# Patient Record
Sex: Male | Born: 1945 | Race: White | Hispanic: No | Marital: Married | Smoking: Former smoker
Health system: Southern US, Community
[De-identification: ages and names within clinical notes are randomized; demographics above are authoritative.]

## PROBLEM LIST (undated history)

## (undated) DIAGNOSIS — I7121 Aneurysm of the ascending aorta, without rupture: Secondary | ICD-10-CM

## (undated) DIAGNOSIS — I712 Thoracic aortic aneurysm, without rupture: Secondary | ICD-10-CM

## (undated) DIAGNOSIS — I499 Cardiac arrhythmia, unspecified: Secondary | ICD-10-CM

## (undated) DIAGNOSIS — I482 Chronic atrial fibrillation, unspecified: Secondary | ICD-10-CM

## (undated) DIAGNOSIS — Z973 Presence of spectacles and contact lenses: Secondary | ICD-10-CM

## (undated) DIAGNOSIS — I420 Dilated cardiomyopathy: Secondary | ICD-10-CM

## (undated) DIAGNOSIS — Z960 Presence of urogenital implants: Secondary | ICD-10-CM

## (undated) DIAGNOSIS — Z9889 Other specified postprocedural states: Secondary | ICD-10-CM

## (undated) DIAGNOSIS — Z978 Presence of other specified devices: Secondary | ICD-10-CM

## (undated) DIAGNOSIS — N400 Enlarged prostate without lower urinary tract symptoms: Secondary | ICD-10-CM

## (undated) DIAGNOSIS — E785 Hyperlipidemia, unspecified: Secondary | ICD-10-CM

## (undated) DIAGNOSIS — I77810 Thoracic aortic ectasia: Secondary | ICD-10-CM

## (undated) DIAGNOSIS — Z7901 Long term (current) use of anticoagulants: Secondary | ICD-10-CM

## (undated) DIAGNOSIS — R339 Retention of urine, unspecified: Secondary | ICD-10-CM

## (undated) DIAGNOSIS — R112 Nausea with vomiting, unspecified: Secondary | ICD-10-CM

## (undated) DIAGNOSIS — M1990 Unspecified osteoarthritis, unspecified site: Secondary | ICD-10-CM

## (undated) HISTORY — PX: TOTAL KNEE ARTHROPLASTY: SHX125

## (undated) HISTORY — DX: Hyperlipidemia, unspecified: E78.5

## (undated) HISTORY — PX: CARDIAC CATHETERIZATION: SHX172

## (undated) HISTORY — PX: BACK SURGERY: SHX140

## (undated) HISTORY — PX: CARDIOVASCULAR STRESS TEST: SHX262

## (undated) HISTORY — PX: JOINT REPLACEMENT: SHX530

## (undated) HISTORY — PX: TRANSTHORACIC ECHOCARDIOGRAM: SHX275

---

## 2014-12-06 ENCOUNTER — Other Ambulatory Visit: Payer: Self-pay | Admitting: Neurosurgery

## 2014-12-26 ENCOUNTER — Encounter (HOSPITAL_COMMUNITY): Payer: Self-pay

## 2014-12-26 ENCOUNTER — Encounter (HOSPITAL_COMMUNITY)
Admission: RE | Admit: 2014-12-26 | Discharge: 2014-12-26 | Disposition: A | Discharge status: Home / Self Care | Payer: Medicare Other | Source: Ambulatory Visit | Attending: Neurosurgery | Admitting: Neurosurgery

## 2014-12-26 DIAGNOSIS — Z79899 Other long term (current) drug therapy: Secondary | ICD-10-CM | POA: Insufficient documentation

## 2014-12-26 DIAGNOSIS — I4891 Unspecified atrial fibrillation: Secondary | ICD-10-CM | POA: Diagnosis not present

## 2014-12-26 DIAGNOSIS — Z7901 Long term (current) use of anticoagulants: Secondary | ICD-10-CM | POA: Insufficient documentation

## 2014-12-26 DIAGNOSIS — I10 Essential (primary) hypertension: Secondary | ICD-10-CM | POA: Insufficient documentation

## 2014-12-26 DIAGNOSIS — R9431 Abnormal electrocardiogram [ECG] [EKG]: Secondary | ICD-10-CM | POA: Diagnosis not present

## 2014-12-26 DIAGNOSIS — Z01818 Encounter for other preprocedural examination: Secondary | ICD-10-CM | POA: Diagnosis present

## 2014-12-26 DIAGNOSIS — Z01812 Encounter for preprocedural laboratory examination: Secondary | ICD-10-CM | POA: Insufficient documentation

## 2014-12-26 HISTORY — DX: Dilated cardiomyopathy: I42.0

## 2014-12-26 HISTORY — DX: Thoracic aortic ectasia: I77.810

## 2014-12-26 HISTORY — DX: Cardiac arrhythmia, unspecified: I49.9

## 2014-12-26 HISTORY — DX: Unspecified osteoarthritis, unspecified site: M19.90

## 2014-12-26 LAB — APTT: APTT: 32 s (ref 24–37)

## 2014-12-26 LAB — CBC
HEMATOCRIT: 49.3 % (ref 39.0–52.0)
HEMOGLOBIN: 16.9 g/dL (ref 13.0–17.0)
MCH: 31.6 pg (ref 26.0–34.0)
MCHC: 34.3 g/dL (ref 30.0–36.0)
MCV: 92.1 fL (ref 78.0–100.0)
Platelets: 149 10*3/uL — ABNORMAL LOW (ref 150–400)
RBC: 5.35 MIL/uL (ref 4.22–5.81)
RDW: 12.8 % (ref 11.5–15.5)
WBC: 7.6 10*3/uL (ref 4.0–10.5)

## 2014-12-26 LAB — PROTIME-INR
INR: 1.66 — AB (ref 0.00–1.49)
PROTHROMBIN TIME: 19.6 s — AB (ref 11.6–15.2)

## 2014-12-26 LAB — BASIC METABOLIC PANEL
ANION GAP: 6 (ref 5–15)
BUN: 14 mg/dL (ref 6–20)
CALCIUM: 8.9 mg/dL (ref 8.9–10.3)
CO2: 27 mmol/L (ref 22–32)
CREATININE: 1.12 mg/dL (ref 0.61–1.24)
Chloride: 107 mmol/L (ref 101–111)
GFR calc non Af Amer: >60 mL/min (ref >60)
Glucose, Bld: 95 mg/dL (ref 65–99)
Potassium: 3.9 mmol/L (ref 3.5–5.1)
SODIUM: 140 mmol/L (ref 135–145)

## 2014-12-26 LAB — SURGICAL PCR SCREEN
MRSA, PCR: NEGATIVE
Staphylococcus aureus: NEGATIVE

## 2014-12-26 NOTE — Progress Notes (Addendum)
Anesthesia Chart Review: Patient is a 69 year old male scheduled for L4-5 laminectomy and foraminotomy on 9/05/15/14 by Dr. Wynetta Emery.  History includes smoking, afib, arthritis, TKR. He reports she "woke up too early" from anesthesia in the past.   Cardiologist is Dr. Rolene Arbour who signed a note stating, "He is low risk for cardiac events with the planned surgery. He may hold warfarin 1 week prior and resume when acceptable from a surgical standpoint."  Cardiology records requested, but are still pending.   Meds include atenolol at HS, folvite, warfarin.   12/26/14 EKG: Afib at 84 bpm, PVCs, cannot rule out anterior infarct (age undetermined). (HR was recorded at 41 bpm on arrival with BP of 81/64. Unfortunately, his BP was not rechecked.)  Preoperative labs noted. PT/INR elevated at 19.6/1.66. He is scheduled to hold warfarin starting 12/27/14. Will recheck a PT/INR on the day of surgery.  Chart will be left for follow-up regarding any additional cardiology records.  Velna Ochs [facility] Short Stay Center/Anesthesiology Phone [phone] 12/26/2014 4:58 PM  Addendum: I called and spoke with patient this afternoon, and I called and spoke with nurse Violet at [facility] with Dr. Rolene Arbour. She was able to send needed records. Patient said he has known issues with hypotension. No SOB or chest pain.   Patient was last seen by Dr. Rolene Arbour on 12/01/14. BP was 110/64, HR 74 bpm. EKG showed afib at 91 bpm, frequent ectopic ventricular beats, non-specific T wave abnormality. Patient has since moved to [place] (last week in fact) and is still in the process of deciding if he will continue care with Dr. Rolene Arbour in [place] or find a local cardiologist in or near [place].   05/26/14 Echo [facility] Cardiology): Mild to moderate global LV dysfunction. Estimated LVEF 40-45%. Moderately dilated aortic root with mild aortic insufficiency, mild LAE with atrial fib.  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 There is no evidence of ischemia. The patient had rapid rise in his heart rate with activity and his beta blocker is being increased on that basis. (Dr. Loura Halt)  04/10/06 Cardiac Cath [facility]): Mild non-obstructive plaquing without obstructive coronary lesions. Mild global LV dysfunction, EF 40-45%. Normal right heart pressures. 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 He is maintained on atenolol. His BP would not allow the addition of an ACE inhibitor. (Dr. Loura Halt)  He will get a repeat PT/INR and vitals on arrival. If these are acceptable then I would anticipate that he could proceed as planned.  Velna Ochs [facility] Short Stay Center/Anesthesiology Phone [phone] 12/28/2014 4:14 PM

## 2014-12-26 NOTE — Pre-Procedure Instructions (Signed)
    Christopher Goodwin  [address]  No Pharmacies Listed   Your procedure is scheduled on 01/03/15.  Report to [facility] Admitting at 630 A.M.  Call this number if you have problems the morning of surgery:  [phone]   Remember:  Do not eat food or drink liquids after midnight.  Take these medicines the morning of surgery with A SIP OF WATER --   Do not wear jewelry, make-up or nail polish.  Do not wear lotions, powders, or perfumes.  You may wear deodorant.  Do not shave 48 hours prior to surgery.  Men may shave face and neck.  Do not bring valuables to the hospital.  [facility] is not responsible for any belongings or valuables.  Contacts, dentures or bridgework may not be worn into surgery.  Leave your suitcase in the car.  After surgery it may be brought to your room.  For patients admitted to the hospital, discharge time will be determined by your treatment team.  Patients discharged the day of surgery will not be allowed to drive home.   Name and phone number of your driver:   Special instructions:  Please read over the following fact sheets that you were given. Pain Booklet, Coughing and Deep Breathing, MRSA Information and Surgical Site Infection Prevention

## 2014-12-28 ENCOUNTER — Encounter (HOSPITAL_COMMUNITY): Payer: Self-pay

## 2015-01-02 MED ORDER — DEXAMETHASONE SODIUM PHOSPHATE 10 MG/ML IJ SOLN
10.0000 mg | INTRAMUSCULAR | Status: AC
  Administered 2015-01-03: 10 mg via INTRAVENOUS
  Filled 2015-01-02: qty 1

## 2015-01-02 MED ORDER — DEXTROSE 5 % IV SOLN
3.0000 g | INTRAVENOUS | Status: AC
  Administered 2015-01-03: 3 g via INTRAVENOUS
  Filled 2015-01-02: qty 3000

## 2015-01-03 ENCOUNTER — Encounter (HOSPITAL_COMMUNITY)
Admission: RE | Disposition: A | Discharge status: Home / Self Care | Payer: Self-pay | Source: Ambulatory Visit | Attending: Neurosurgery

## 2015-01-03 ENCOUNTER — Encounter (HOSPITAL_COMMUNITY): Payer: Self-pay | Admitting: General Practice

## 2015-01-03 ENCOUNTER — Inpatient Hospital Stay (HOSPITAL_COMMUNITY)
Admission: RE | Admit: 2015-01-03 | Discharge: 2015-01-03 | DRG: 517 | Disposition: A | Discharge status: Home / Self Care | Payer: Medicare Other | Source: Ambulatory Visit | Attending: Neurosurgery | Admitting: Neurosurgery

## 2015-01-03 ENCOUNTER — Inpatient Hospital Stay (HOSPITAL_COMMUNITY): Payer: Medicare Other

## 2015-01-03 ENCOUNTER — Inpatient Hospital Stay (HOSPITAL_COMMUNITY): Payer: Medicare Other | Admitting: Vascular Surgery

## 2015-01-03 ENCOUNTER — Inpatient Hospital Stay (HOSPITAL_COMMUNITY): Payer: Medicare Other | Admitting: Anesthesiology

## 2015-01-03 DIAGNOSIS — Z96659 Presence of unspecified artificial knee joint: Secondary | ICD-10-CM | POA: Diagnosis not present

## 2015-01-03 DIAGNOSIS — M79605 Pain in left leg: Secondary | ICD-10-CM | POA: Diagnosis present

## 2015-01-03 DIAGNOSIS — Z79899 Other long term (current) drug therapy: Secondary | ICD-10-CM

## 2015-01-03 DIAGNOSIS — F17290 Nicotine dependence, other tobacco product, uncomplicated: Secondary | ICD-10-CM | POA: Diagnosis present

## 2015-01-03 DIAGNOSIS — Z419 Encounter for procedure for purposes other than remedying health state, unspecified: Secondary | ICD-10-CM

## 2015-01-03 DIAGNOSIS — Z7901 Long term (current) use of anticoagulants: Secondary | ICD-10-CM

## 2015-01-03 DIAGNOSIS — M4806 Spinal stenosis, lumbar region: Principal | ICD-10-CM | POA: Diagnosis present

## 2015-01-03 DIAGNOSIS — M48061 Spinal stenosis, lumbar region without neurogenic claudication: Secondary | ICD-10-CM | POA: Diagnosis present

## 2015-01-03 HISTORY — PX: LUMBAR LAMINECTOMY/DECOMPRESSION MICRODISCECTOMY: SHX5026

## 2015-01-03 LAB — PROTIME-INR
INR: 1.11 (ref 0.00–1.49)
Prothrombin Time: 14.5 seconds (ref 11.6–15.2)

## 2015-01-03 SURGERY — LUMBAR LAMINECTOMY/DECOMPRESSION MICRODISCECTOMY 1 LEVEL
Anesthesia: General | Site: Back | Laterality: Left

## 2015-01-03 MED ORDER — FOLIC ACID 1 MG PO TABS
1.0000 mg | ORAL_TABLET | Freq: Every day | ORAL | Status: DC
  Filled 2015-01-03: qty 1

## 2015-01-03 MED ORDER — MIDAZOLAM HCL 5 MG/5ML IJ SOLN
INTRAMUSCULAR | Status: DC | PRN
  Administered 2015-01-03: 2 mg via INTRAVENOUS

## 2015-01-03 MED ORDER — VITAMIN D 1000 UNITS PO TABS: 2000.0000 [IU] | ORAL_TABLET | Freq: Every day | ORAL | Status: DC

## 2015-01-03 MED ORDER — MIDAZOLAM HCL 2 MG/2ML IJ SOLN
INTRAMUSCULAR | Status: AC
  Filled 2015-01-03: qty 4

## 2015-01-03 MED ORDER — SUCCINYLCHOLINE CHLORIDE 20 MG/ML IJ SOLN
INTRAMUSCULAR | Status: AC
  Filled 2015-01-03: qty 1

## 2015-01-03 MED ORDER — THROMBIN 5000 UNITS EX SOLR
CUTANEOUS | Status: DC | PRN
  Administered 2015-01-03 (×2): 5000 [IU] via TOPICAL

## 2015-01-03 MED ORDER — HYDROMORPHONE HCL 1 MG/ML IJ SOLN: 0.5000 mg | INTRAMUSCULAR | Status: DC | PRN

## 2015-01-03 MED ORDER — FENTANYL CITRATE (PF) 250 MCG/5ML IJ SOLN
INTRAMUSCULAR | Status: DC | PRN
  Administered 2015-01-03: 100 ug via INTRAVENOUS

## 2015-01-03 MED ORDER — ARTIFICIAL TEARS OP OINT
TOPICAL_OINTMENT | OPHTHALMIC | Status: DC | PRN
  Administered 2015-01-03: 1 via OPHTHALMIC

## 2015-01-03 MED ORDER — CYCLOBENZAPRINE HCL 10 MG PO TABS: 10.0000 mg | ORAL_TABLET | Freq: Three times a day (TID) | ORAL | Status: DC | PRN

## 2015-01-03 MED ORDER — LACTATED RINGERS IV SOLN
INTRAVENOUS | Status: DC | PRN
  Administered 2015-01-03 (×2): via INTRAVENOUS

## 2015-01-03 MED ORDER — CYANOCOBALAMIN 1000 MCG/ML IJ SOLN: 1000.0000 ug | INTRAMUSCULAR | Status: DC

## 2015-01-03 MED ORDER — SODIUM CHLORIDE 0.9 % IR SOLN
Status: DC | PRN
  Administered 2015-01-03: 10:00:00

## 2015-01-03 MED ORDER — MENTHOL 3 MG MT LOZG
1.0000 | LOZENGE | OROMUCOSAL | Status: DC | PRN
  Filled 2015-01-03: qty 9

## 2015-01-03 MED ORDER — FENTANYL CITRATE (PF) 250 MCG/5ML IJ SOLN
INTRAMUSCULAR | Status: AC
  Filled 2015-01-03: qty 5

## 2015-01-03 MED ORDER — ROCURONIUM BROMIDE 50 MG/5ML IV SOLN
INTRAVENOUS | Status: AC
  Filled 2015-01-03: qty 1

## 2015-01-03 MED ORDER — ACETAMINOPHEN 325 MG PO TABS: 650.0000 mg | ORAL_TABLET | ORAL | Status: DC | PRN

## 2015-01-03 MED ORDER — HYDROMORPHONE HCL 1 MG/ML IJ SOLN: 0.2500 mg | INTRAMUSCULAR | Status: DC | PRN

## 2015-01-03 MED ORDER — GLYCOPYRROLATE 0.2 MG/ML IJ SOLN
INTRAMUSCULAR | Status: DC | PRN
  Administered 2015-01-03: 0.4 mg via INTRAVENOUS

## 2015-01-03 MED ORDER — LIDOCAINE-EPINEPHRINE 1 %-1:100000 IJ SOLN
INTRAMUSCULAR | Status: DC | PRN
  Administered 2015-01-03: 10 mL

## 2015-01-03 MED ORDER — ONDANSETRON HCL 4 MG/2ML IJ SOLN: 4.0000 mg | INTRAMUSCULAR | Status: DC | PRN

## 2015-01-03 MED ORDER — PHENOL 1.4 % MT LIQD: 1.0000 | OROMUCOSAL | Status: DC | PRN

## 2015-01-03 MED ORDER — BUPIVACAINE HCL (PF) 0.25 % IJ SOLN
INTRAMUSCULAR | Status: DC | PRN
  Administered 2015-01-03 (×2): 10 mL

## 2015-01-03 MED ORDER — PROPOFOL 10 MG/ML IV BOLUS
INTRAVENOUS | Status: DC | PRN
  Administered 2015-01-03: 200 mg via INTRAVENOUS

## 2015-01-03 MED ORDER — MEPERIDINE HCL 25 MG/ML IJ SOLN: 6.2500 mg | INTRAMUSCULAR | Status: DC | PRN

## 2015-01-03 MED ORDER — 0.9 % SODIUM CHLORIDE (POUR BTL) OPTIME
TOPICAL | Status: DC | PRN
  Administered 2015-01-03: 1000 mL

## 2015-01-03 MED ORDER — HYDROCODONE-ACETAMINOPHEN 5-325 MG PO TABS: 1.0000 | ORAL_TABLET | ORAL | Status: DC | PRN

## 2015-01-03 MED ORDER — ROCURONIUM BROMIDE 100 MG/10ML IV SOLN
INTRAVENOUS | Status: DC | PRN
  Administered 2015-01-03: 50 mg via INTRAVENOUS

## 2015-01-03 MED ORDER — PHENYLEPHRINE 40 MCG/ML (10ML) SYRINGE FOR IV PUSH (FOR BLOOD PRESSURE SUPPORT)
PREFILLED_SYRINGE | INTRAVENOUS | Status: AC
  Filled 2015-01-03: qty 10

## 2015-01-03 MED ORDER — ATENOLOL 50 MG PO TABS
50.0000 mg | ORAL_TABLET | Freq: Every day | ORAL | Status: DC
  Filled 2015-01-03: qty 1

## 2015-01-03 MED ORDER — ACETAMINOPHEN 650 MG RE SUPP: 650.0000 mg | RECTAL | Status: DC | PRN

## 2015-01-03 MED ORDER — ONDANSETRON HCL 4 MG/2ML IJ SOLN
INTRAMUSCULAR | Status: DC | PRN
  Administered 2015-01-03: 4 mg via INTRAVENOUS

## 2015-01-03 MED ORDER — SODIUM CHLORIDE 0.9 % IJ SOLN
INTRAMUSCULAR | Status: AC
  Filled 2015-01-03: qty 10

## 2015-01-03 MED ORDER — EPHEDRINE SULFATE 50 MG/ML IJ SOLN
INTRAMUSCULAR | Status: DC | PRN
  Administered 2015-01-03 (×2): 10 mg via INTRAVENOUS

## 2015-01-03 MED ORDER — SODIUM CHLORIDE 0.9 % IJ SOLN: 3.0000 mL | INTRAMUSCULAR | Status: DC | PRN

## 2015-01-03 MED ORDER — NEOSTIGMINE METHYLSULFATE 10 MG/10ML IV SOLN
INTRAVENOUS | Status: DC | PRN
Start: 2015-01-03 — End: 2015-01-03
  Administered 2015-01-03: 3 mg via INTRAVENOUS

## 2015-01-03 MED ORDER — LIDOCAINE HCL (CARDIAC) 20 MG/ML IV SOLN
INTRAVENOUS | Status: AC
  Filled 2015-01-03: qty 5

## 2015-01-03 MED ORDER — PROMETHAZINE HCL 25 MG/ML IJ SOLN
6.2500 mg | INTRAMUSCULAR | Status: DC | PRN
Start: 2015-01-03 — End: 2015-01-03

## 2015-01-03 MED ORDER — HEMOSTATIC AGENTS (NO CHARGE) OPTIME
TOPICAL | Status: DC | PRN
  Administered 2015-01-03: 1 via TOPICAL

## 2015-01-03 MED ORDER — LIDOCAINE HCL (CARDIAC) 20 MG/ML IV SOLN
INTRAVENOUS | Status: DC | PRN
  Administered 2015-01-03: 100 mg via INTRAVENOUS

## 2015-01-03 MED ORDER — PROPOFOL 10 MG/ML IV BOLUS
INTRAVENOUS | Status: AC
  Filled 2015-01-03: qty 20

## 2015-01-03 MED ORDER — SODIUM CHLORIDE 0.9 % IJ SOLN
3.0000 mL | Freq: Two times a day (BID) | INTRAMUSCULAR | Status: DC
  Administered 2015-01-03: 3 mL via INTRAVENOUS

## 2015-01-03 MED ORDER — CEFAZOLIN SODIUM 1-5 GM-% IV SOLN
1.0000 g | Freq: Three times a day (TID) | INTRAVENOUS | Status: DC
  Administered 2015-01-03: 1 g via INTRAVENOUS
  Filled 2015-01-03 (×2): qty 50

## 2015-01-03 MED ORDER — EPHEDRINE SULFATE 50 MG/ML IJ SOLN
INTRAMUSCULAR | Status: AC
  Filled 2015-01-03: qty 1

## 2015-01-03 SURGICAL SUPPLY — 51 items
BAG DECANTER FOR FLEXI CONT (MISCELLANEOUS) ×3 IMPLANT
BENZOIN TINCTURE PRP APPL 2/3 (GAUZE/BANDAGES/DRESSINGS) ×3 IMPLANT
BLADE CLIPPER SURG (BLADE) IMPLANT
BLADE SURG 11 STRL SS (BLADE) ×3 IMPLANT
BRUSH SCRUB EZ PLAIN DRY (MISCELLANEOUS) ×3 IMPLANT
BUR MATCHSTICK NEURO 3.0 LAGG (BURR) ×3 IMPLANT
BUR PRECISION FLUTE 6.0 (BURR) ×3 IMPLANT
CANISTER SUCT 3000ML PPV (MISCELLANEOUS) ×3 IMPLANT
CLOSURE WOUND 1/2 X4 (GAUZE/BANDAGES/DRESSINGS) ×1
DECANTER SPIKE VIAL GLASS SM (MISCELLANEOUS) ×6 IMPLANT
DRAPE LAPAROTOMY 100X72X124 (DRAPES) ×3 IMPLANT
DRAPE MICROSCOPE LEICA (MISCELLANEOUS) ×6 IMPLANT
DRAPE POUCH INSTRU U-SHP 10X18 (DRAPES) ×3 IMPLANT
DRAPE PROXIMA HALF (DRAPES) IMPLANT
DRAPE SURG 17X23 STRL (DRAPES) ×3 IMPLANT
DRSG OPSITE POSTOP 4X6 (GAUZE/BANDAGES/DRESSINGS) ×6 IMPLANT
DURAPREP 26ML APPLICATOR (WOUND CARE) ×3 IMPLANT
ELECT BLADE 4.0 EZ CLEAN MEGAD (MISCELLANEOUS) ×3
ELECT REM PT RETURN 9FT ADLT (ELECTROSURGICAL) ×3
ELECTRODE BLDE 4.0 EZ CLN MEGD (MISCELLANEOUS) ×1 IMPLANT
ELECTRODE REM PT RTRN 9FT ADLT (ELECTROSURGICAL) ×1 IMPLANT
GAUZE SPONGE 4X4 12PLY STRL (GAUZE/BANDAGES/DRESSINGS) ×3 IMPLANT
GAUZE SPONGE 4X4 16PLY XRAY LF (GAUZE/BANDAGES/DRESSINGS) IMPLANT
GLOVE BIO SURGEON STRL SZ8 (GLOVE) ×3 IMPLANT
GLOVE EXAM NITRILE LRG STRL (GLOVE) IMPLANT
GLOVE EXAM NITRILE MD LF STRL (GLOVE) IMPLANT
GLOVE EXAM NITRILE XL STR (GLOVE) IMPLANT
GLOVE EXAM NITRILE XS STR PU (GLOVE) IMPLANT
GLOVE INDICATOR 8.5 STRL (GLOVE) ×3 IMPLANT
GOWN STRL REUS W/ TWL LRG LVL3 (GOWN DISPOSABLE) ×1 IMPLANT
GOWN STRL REUS W/ TWL XL LVL3 (GOWN DISPOSABLE) ×2 IMPLANT
GOWN STRL REUS W/TWL 2XL LVL3 (GOWN DISPOSABLE) IMPLANT
GOWN STRL REUS W/TWL LRG LVL3 (GOWN DISPOSABLE) ×2
GOWN STRL REUS W/TWL XL LVL3 (GOWN DISPOSABLE) ×4
KIT BASIN OR (CUSTOM PROCEDURE TRAY) ×3 IMPLANT
KIT ROOM TURNOVER OR (KITS) ×3 IMPLANT
LIQUID BAND (GAUZE/BANDAGES/DRESSINGS) ×3 IMPLANT
NEEDLE HYPO 22GX1.5 SAFETY (NEEDLE) ×3 IMPLANT
NEEDLE SPNL 22GX3.5 QUINCKE BK (NEEDLE) ×3 IMPLANT
NS IRRIG 1000ML POUR BTL (IV SOLUTION) ×3 IMPLANT
PACK LAMINECTOMY NEURO (CUSTOM PROCEDURE TRAY) ×3 IMPLANT
RUBBERBAND STERILE (MISCELLANEOUS) ×12 IMPLANT
SPONGE SURGIFOAM ABS GEL SZ50 (HEMOSTASIS) ×3 IMPLANT
STRIP CLOSURE SKIN 1/2X4 (GAUZE/BANDAGES/DRESSINGS) ×2 IMPLANT
SUT VIC AB 0 CT1 18XCR BRD8 (SUTURE) ×1 IMPLANT
SUT VIC AB 0 CT1 8-18 (SUTURE) ×2
SUT VIC AB 2-0 CT1 18 (SUTURE) ×3 IMPLANT
SUT VICRYL 4-0 PS2 18IN ABS (SUTURE) ×3 IMPLANT
TOWEL OR 17X24 6PK STRL BLUE (TOWEL DISPOSABLE) ×3 IMPLANT
TOWEL OR 17X26 10 PK STRL BLUE (TOWEL DISPOSABLE) ×3 IMPLANT
WATER STERILE IRR 1000ML POUR (IV SOLUTION) ×3 IMPLANT

## 2015-01-03 NOTE — Anesthesia Preprocedure Evaluation (Addendum)
Anesthesia Evaluation  Patient identified by MRN, date of birth, ID band Patient awake    Reviewed: Allergy & Precautions, NPO status , Patient's Chart, lab work & pertinent test results, reviewed documented beta blocker date and time   History of Anesthesia Complications Negative for: history of anesthetic complications  Airway Mallampati: II  TM Distance: >3 FB Neck ROM: Full    Dental no notable dental hx.    Pulmonary Current Smoker,    Pulmonary exam normal breath sounds clear to auscultation       Cardiovascular Pt. on home beta blockers + Peripheral Vascular Disease  + dysrhythmias Atrial Fibrillation + Valvular Problems/Murmurs AI  Rhythm:Irregular Rate:Normal  EF 40-45%. Global hypokinesis.    Neuro/Psych negative neurological ROS  negative psych ROS   GI/Hepatic negative GI ROS, Neg liver ROS,   Endo/Other  negative endocrine ROS  Renal/GU negative Renal ROS     Musculoskeletal  (+) Arthritis ,   Abdominal   Peds  Hematology negative hematology ROS (+)   Anesthesia Other Findings   Reproductive/Obstetrics                            Anesthesia Physical Anesthesia Plan  ASA: III  Anesthesia Plan: General   Post-op Pain Management:    Induction: Intravenous  Airway Management Planned: Oral ETT  Additional Equipment: None  Intra-op Plan:   Post-operative Plan: Extubation in OR  Informed Consent: I have reviewed the patients History and Physical, chart, labs and discussed the procedure including the risks, benefits and alternatives for the proposed anesthesia with the patient or authorized representative who has indicated his/her understanding and acceptance.   Dental advisory given  Plan Discussed with: CRNA  Anesthesia Plan Comments:         Anesthesia Quick Evaluation

## 2015-01-03 NOTE — Op Note (Addendum)
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  Surgeon: Jillyn Hidden cram  Asst.: Shirlean Kelly  Anesthesia: Gen.  EBL: Minimal  History of present illness: Patient is a very pleasant 68 years and is a progress worsening left hip and leg pain rating down at L4 and L5 nerve root pattern the left leg. He failed all forms of conservative treatment and workup revealed severe lateral recess stenosis and foraminal stenosis the L4 and L5 nerve roots on the left. Due to patient's failed conservative treatment imaging findings and progressive clinical syndrome I recommended decompression and foraminotomies of the L4 and L5 nerve roots. I extensively went over the risks and benefits of the operation with the patient as well as perioperative course expectations of outcome and alternatives of surgery and he understands and agrees to proceed forward.  Operative procedure: Patient brought in the or was induced on general anesthesia positioned prone the Wilson frame his back was prepped and draped in routine sterile fashion preoperative x-ray localize the appropriate level. After infiltration 10 mL lidocaine with epi a midline incision was made and Bovie left car was used to calcification subperiosteal dissections care lamina of L4 and L5 on the left. Interoperative x-ray confirmed indication for level. There was marked facet arthropathy and severe overgrowth of the spinous process the lateral aspect of the spinous process was drilled down as well as the medial aspect of the facet joint in her aspect of L4 and superior aspect of L5. Laminotomy was begun with a 2 mm Kerrison punch ligament was identified and removed in piecemeal fashion  exposing the thecal sac. Under microscopic illumination there was a very large spur and extensive hypertrophy of the medial facet complex and ligament flavum. This was teased off of the dura and removed in piecemeal fashion decompress the lateral canal. I identified the L5 pedicle under bit the medial gutter and unroofed the L5 neuroforamen decompressing the L5 nerve root. Marching superiorly and extended up to the inferior aspect of the L4 foramen palpate the pedicle identified the takeoff of the L4 nerve root and decompress the L4 foramen. Expected disc space there was no herniation no frank stenosis of this was left alone. The wounds and copious irrigated meticulous in space was maintained the foramina were again reexplored with a coronary.and coccyx confirmed patent. Gelfoam was laid up the dura the muscle fascia proximate layers with Vicryls was closed running 4-0 subcuticular Dermabond benzo and Steri-Strips were applied patient recovered in stable condition. At the end of case all needle counts sponge counts were correct.

## 2015-01-03 NOTE — Anesthesia Postprocedure Evaluation (Signed)
Anesthesia Post Note  Patient: Christopher Goodwin  Procedure(s) Performed: Procedure(s) (LRB): Lumbar four, lumbar five left Laminectomy and Foraminotomy  (Left)  Anesthesia type: General  Patient location: PACU  Post pain: Pain level controlled  Post assessment: Post-op Vital signs reviewed  Last Vitals: BP 125/82 mmHg  Pulse 96  Temp(Src) 36.1 C  Resp 16  Ht  (1.88 m)  Wt 265 lb (120.203 kg)  BMI 34.01 kg/m2  SpO2 96%  Post vital signs: Reviewed  Level of consciousness: sedated  Complications: No apparent anesthesia complications

## 2015-01-03 NOTE — Progress Notes (Signed)
Patient alert and oriented, mae's well, voiding adequate amount of urine, swallowing without difficulty, no c/o pain. Patient discharged home with family. Script and discharged instructions given to patient. Patient and family stated understanding of d/c instructions given and has an appointment with MD. 

## 2015-01-03 NOTE — Anesthesia Procedure Notes (Signed)
Procedure Name: Intubation Date/Time: 01/03/2015 8:38 AM Performed by: Jerilee Hoh Pre-anesthesia Checklist: Patient identified, Emergency Drugs available, Suction available and Patient being monitored Patient Re-evaluated:Patient Re-evaluated prior to inductionOxygen Delivery Method: Circle system utilized Preoxygenation: Pre-oxygenation with 100% oxygen Intubation Type: IV induction Ventilation: Mask ventilation without difficulty Laryngoscope Size: 4 and Mac Grade View: Grade II Tube type: Oral Tube size: 7.5 mm Number of attempts: 1 Airway Equipment and Method: Stylet Placement Confirmation: ETT inserted through vocal cords under direct vision,  positive ETCO2 and breath sounds checked- equal and bilateral Secured at: 23 cm Tube secured with: Tape Dental Injury: Teeth and Oropharynx as per pre-operative assessment

## 2015-01-03 NOTE — Plan of Care (Signed)
Problem: Consults Goal: Diagnosis - Spinal Surgery Outcome: Completed/Met Date Met:  01/03/15 Microdiscectomy

## 2015-01-03 NOTE — H&P (Signed)
Christopher Goodwin is an 69 y.o. male.   Chief Complaint:  left leg pain HPI: Patient is a very pleasant 69 year old gentleman has had progressive worsening left hip and leg pain rating down posterior lateral 5 posterior lateral calf cages and top his foot and big toe. This was consistent with both an L4 and L5 nerve root pattern workup revealed severe spondylosis stenosis and lateral recess stenosis at L4-5. Patient had virtually no midline back pain had no movement on flexion extension lumbar x-rays. So due to his isolated radicular pain and symptoms I recommended laminectomy foraminotomies decompression of the L4 and L5 nerve roots on the left. I've extensively gone over the risks and benefits of the operation with the patient as well as perioperative course expectations of outcome and alternatives of surgery and he understands and agrees to proceed forward.  Past Medical History  Diagnosis Date  . Complication of anesthesia     woke up to early  . Dysrhythmia     AF  . Arthritis   . Dilated aortic root     4.9 cm by 05/26/14 echo [facility] Cardiology, Dr. Donnal Debar)  . Cardiomyopathy, dilated, nonischemic     by 2008 cath and 2013 stress test results; as of 12/28/14: has been followed by Dr. Bernardo Heater in [place] (last seen 11/2014), moved to [facility] 12/2014    Past Surgical History  Procedure Laterality Date  . Joint replacement      2 knee tk  . Cardiac catheterization      04/10/06 [facility]): Mild plaquing w/o obstructive CAD, LVEF 40-45%, normal right heart pressures.   . Cardiovascular stress test      02/27/12: Nuclear stress test showed no ischemia, mild LV dysfunction, EF 45%. Images c/w non-ischemic dilated cardiomyopathy. (Dr. Bernardo Heater)  . US echocardiography      05/26/14 Echo [facility] Card[facility]): Mild-mod global LV dysfunction, EF 40-45%, moderately dilated aortic root at 4.9 cm with mild AR, mild LAE, afib.    History  reviewed. No pertinent family history. Social History:  reports that he has been smoking Cigars.  He does not have any smokeless tobacco history on file. He reports that he drinks alcohol. He reports that he does not use illicit drugs.  Allergies: No Known Allergies  Medications Prior to Admission  Medication Sig Dispense Refill  . atenolol (TENORMIN) 50 MG tablet Take 50 mg by mouth daily.    . Cholecalciferol (VITAMIN D) 2000 UNITS CAPS Take 1 capsule by mouth daily.    . Cyanocobalamin 1000 MCG/ML KIT Inject 1 mL as directed every 30 (thirty) days.    . folic acid (FOLVITE) 1 MG tablet Take 1 mg by mouth daily.    Marland Kitchen warfarin (COUMADIN) 6 MG tablet Take 6 mg by mouth daily.      Results for orders placed or performed during the hospital encounter of 01/03/15 (from the past 48 hour(s))  Protime-INR     Status: None   Collection Time: 01/03/15  6:52 AM  Result Value Ref Range   Prothrombin Time 14.5 11.6 - 15.2 seconds   INR 1.11 0.00 - 1.49   No results found.  Review of Systems  Constitutional: Negative.   HENT: Negative.   Respiratory: Negative.   Cardiovascular: Negative.   Genitourinary: Negative.   Musculoskeletal: Positive for myalgias and back pain.  Skin: Negative.   Neurological: Positive for tingling and sensory change.    Blood pressure 106/58, pulse 56, temperature 97  F (36.1 C), resp. rate 18, height _0  (1.88 m), weight 120.203 kg (265 lb), SpO2 96 %. Physical Exam  Constitutional: He is oriented to person, place, and time. He appears well-developed and well-nourished.  HENT:  Head: Normocephalic.  Eyes: Pupils are equal, round, and reactive to light.  Neck: Normal range of motion.  GI: Soft. Bowel sounds are normal.  Neurological: He is alert and oriented to person, place, and time. He has normal strength. GCS eye subscore is 4. GCS verbal subscore is 5. GCS motor subscore is 6.  Strength is 5 out of 5 in his iliopsoas, quads, and she's, gastrocs, and  tibialis, and EHL.  Skin: Skin is warm and dry.     Assessment/Plan 68 years and presents for a left-sided L4-5 laminectomy foraminotomies and decompression.  CRAM,GARY P 01/03/2015, 8:10 AM

## 2015-01-03 NOTE — Progress Notes (Signed)
Utilization review completed.  

## 2015-01-03 NOTE — Transfer of Care (Signed)
Immediate Anesthesia Transfer of Care Note  Patient: Christopher Goodwin  Procedure(s) Performed: Procedure(s): Lumbar four, lumbar five left Laminectomy and Foraminotomy  (Left)  Patient Location: PACU  Anesthesia Type:General  Level of Consciousness: sedated and patient cooperative  Airway & Oxygen Therapy: Patient Spontanous Breathing and Patient connected to face mask oxygen  Post-op Assessment: Report given to RN, Post -op Vital signs reviewed and stable and Patient moving all extremities  Post vital signs: Reviewed and stable  Last Vitals:  Filed Vitals:   01/03/15 0706  BP:   Pulse:   Temp: 36.1 C  Resp:     Complications: No apparent anesthesia complications

## 2015-01-03 NOTE — Discharge Summary (Signed)
  Physician Discharge Summary  Patient ID: TRES GRZYWACZ MRN: [id number] DOB/AGE: 1945/11/17 69 y.o.  Admit date: 01/03/2015 Discharge date: 01/03/2015  Admission Diagnoses: Lumbar spinal stenosis L4-5  Discharge Diagnoses: Same Active Problems:   Spinal stenosis at L4-L5 level   Discharged Condition: good  Hospital Course: Patient admitted went underwent decompressive limited we did very well postoperatively was angling and voiding spontaneous he and tolerating regular diet stable for discharge home.  Consults: Significant Diagnostic Studies: Treatments: Decompressive laminectomy L4-5 left Discharge Exam: Blood pressure 125/82, pulse 96, temperature 97 F (36.1 C), resp. rate 16, height _0  (1.88 m), weight 120.203 kg (265 lb), SpO2 96 %. Strength out of 5 wound clean dry and intact  Disposition: Home     Medication List    TAKE these medications        atenolol 50 MG tablet  Commonly known as:  TENORMIN  Take 50 mg by mouth daily.     Cyanocobalamin 1000 MCG/ML Kit  Inject 1 mL as directed every 30 (thirty) days.     folic acid 1 MG tablet  Commonly known as:  FOLVITE  Take 1 mg by mouth daily.     HYDROcodone-acetaminophen 5-325 MG tablet  Commonly known as:  NORCO/VICODIN  Take 1-2 tablets by mouth every 4 (four) hours as needed (mild pain).     Vitamin D 2000 UNITS Caps  Take 1 capsule by mouth daily.     warfarin 6 MG tablet  Commonly known as:  COUMADIN  Take 6 mg by mouth daily.           Follow-up Information    Follow up with [facility] P, MD.   Specialty:  Neurosurgery   Contact information:   [address] [place] [address] [phone]       Signed: Elaina Hoops 01/03/2015, 4:47 PM

## 2015-01-03 NOTE — Discharge Instructions (Signed)

## 2015-01-04 ENCOUNTER — Encounter (HOSPITAL_COMMUNITY): Payer: Self-pay | Admitting: Neurosurgery

## 2015-07-13 ENCOUNTER — Other Ambulatory Visit: Payer: Self-pay | Admitting: Cardiology

## 2015-07-13 DIAGNOSIS — I714 Abdominal aortic aneurysm, without rupture, unspecified: Secondary | ICD-10-CM

## 2015-07-13 DIAGNOSIS — I712 Thoracic aortic aneurysm, without rupture, unspecified: Secondary | ICD-10-CM

## 2015-08-10 ENCOUNTER — Ambulatory Visit
Admission: RE | Admit: 2015-08-10 | Discharge: 2015-08-10 | Disposition: A | Discharge status: Home / Self Care | Payer: Medicare Other | Source: Ambulatory Visit | Attending: Cardiology | Admitting: Cardiology

## 2015-08-10 DIAGNOSIS — I712 Thoracic aortic aneurysm, without rupture, unspecified: Secondary | ICD-10-CM

## 2015-08-10 DIAGNOSIS — I714 Abdominal aortic aneurysm, without rupture, unspecified: Secondary | ICD-10-CM

## 2015-08-10 MED ORDER — IOPAMIDOL (ISOVUE-370) INJECTION 76%
75.0000 mL | Freq: Once | INTRAVENOUS | Status: AC | PRN
  Administered 2015-08-10: 75 mL via INTRAVENOUS

## 2016-03-04 ENCOUNTER — Encounter: Payer: Self-pay | Admitting: Unknown Physician Specialty

## 2016-07-06 IMAGING — CR DG LUMBAR SPINE 2-3V
2 series · 2 of 2 positions shown · non-contrast
Comparison: [HOSPITAL] neurosurgery lumbar radiographs 12/06/2014,
and earlier. Outside lumbar MRI 09/19/2014 available on [HOSPITAL] PACS

CLINICAL DATA: 68-year-old male undergoing lumbar surgery. Initial
encounter.

EXAM:
LUMBAR SPINE - 2-3 VIEW

[lateral (1 of 2)]
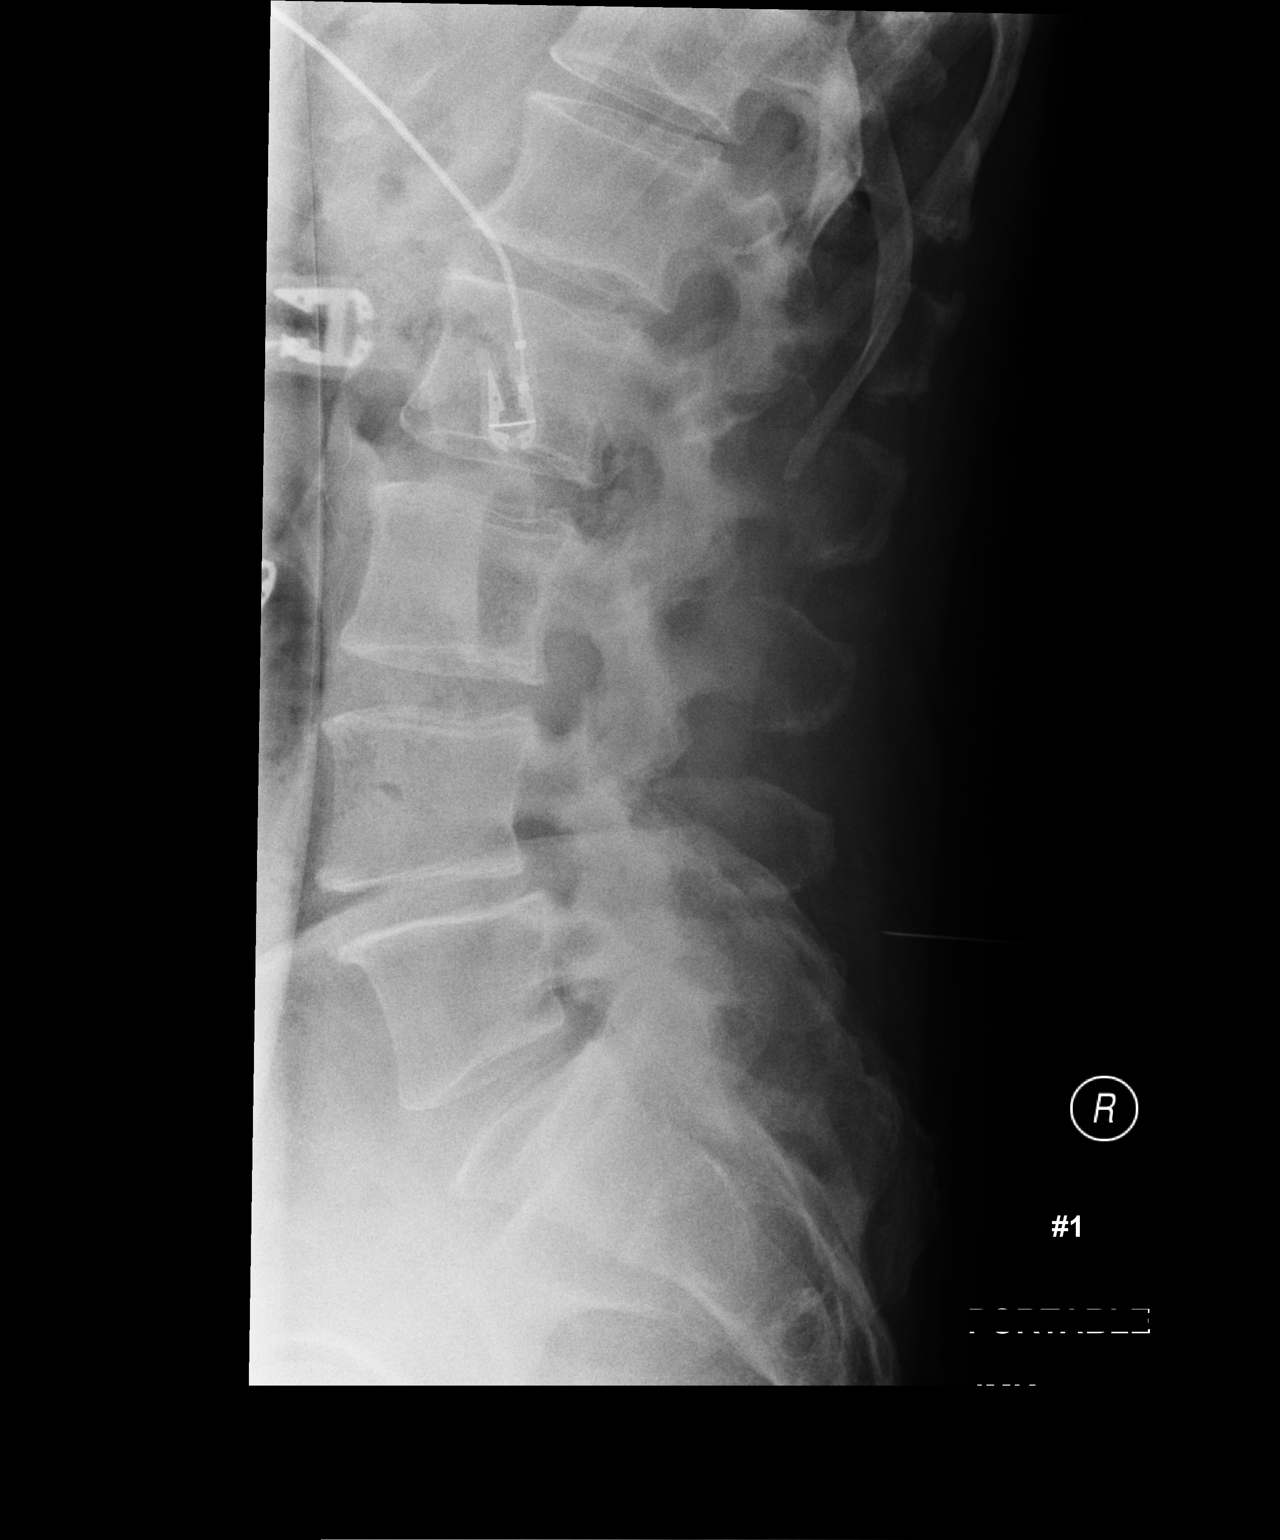

[lateral (2 of 2)]
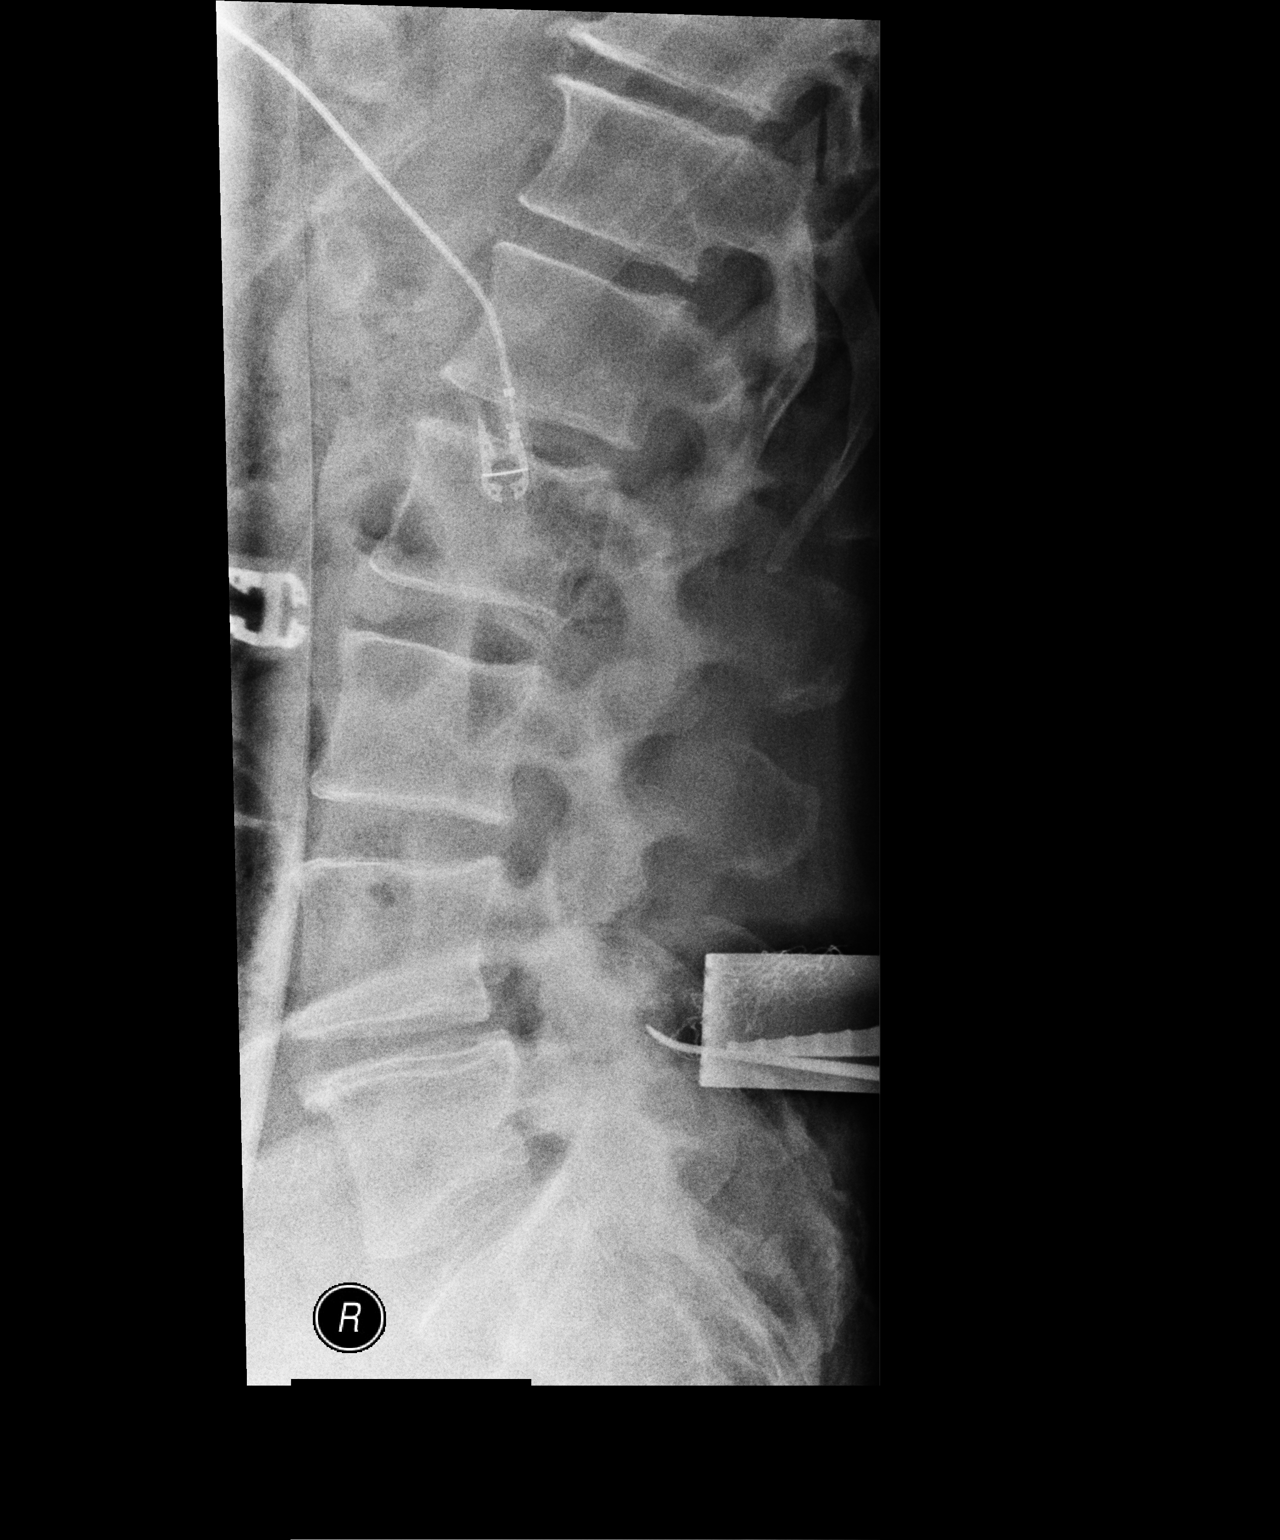

[2 of 2 positions shown; findings below may reference images not displayed]

FINDINGS: Normal lumbar segmentation demonstrated on 11/23/2014 radiographs.
The same numbering system is utilized here.

Intraoperative portable cross-table lateral view of the lumbar spine
at 8684 hours labeled #1. Posterior needle directed at the L4-L5
interspinous and disc space.

Portable cross-table lateral view at 0283 hours labeled #2. Surgical
probe directed toward the L4-L5 disc space, just posterior to the
facets.
IMPRESSION: Intraoperative localization at L4-L5.

## 2016-12-13 ENCOUNTER — Encounter (HOSPITAL_BASED_OUTPATIENT_CLINIC_OR_DEPARTMENT_OTHER): Payer: Self-pay | Admitting: Emergency Medicine

## 2016-12-13 ENCOUNTER — Emergency Department (HOSPITAL_BASED_OUTPATIENT_CLINIC_OR_DEPARTMENT_OTHER)
Admission: EM | Admit: 2016-12-13 | Discharge: 2016-12-13 | Disposition: A | Discharge status: Home / Self Care | Payer: Medicare Other | Attending: Physician Assistant | Admitting: Physician Assistant

## 2016-12-13 DIAGNOSIS — N390 Urinary tract infection, site not specified: Secondary | ICD-10-CM | POA: Diagnosis not present

## 2016-12-13 DIAGNOSIS — Z96653 Presence of artificial knee joint, bilateral: Secondary | ICD-10-CM | POA: Diagnosis not present

## 2016-12-13 DIAGNOSIS — F17290 Nicotine dependence, other tobacco product, uncomplicated: Secondary | ICD-10-CM | POA: Insufficient documentation

## 2016-12-13 DIAGNOSIS — Z7901 Long term (current) use of anticoagulants: Secondary | ICD-10-CM | POA: Diagnosis not present

## 2016-12-13 LAB — URINALYSIS, MICROSCOPIC (REFLEX)

## 2016-12-13 LAB — URINALYSIS, ROUTINE W REFLEX MICROSCOPIC
Bilirubin Urine: NEGATIVE
Glucose, UA: NEGATIVE mg/dL
KETONES UR: NEGATIVE mg/dL
NITRITE: NEGATIVE
PH: 6 (ref 5.0–8.0)
PROTEIN: 30 mg/dL — AB
Specific Gravity, Urine: 1.01 (ref 1.005–1.030)

## 2016-12-13 MED ORDER — CEPHALEXIN 500 MG PO CAPS: 500.0000 mg | ORAL_CAPSULE | Freq: Four times a day (QID) | ORAL | 0 refills | Status: DC

## 2016-12-13 MED ORDER — CEPHALEXIN 250 MG PO CAPS
500.0000 mg | ORAL_CAPSULE | Freq: Once | ORAL | Status: AC
  Administered 2016-12-13: 500 mg via ORAL
  Filled 2016-12-13: qty 2

## 2016-12-13 NOTE — ED Notes (Signed)
ED Provider at bedside. 

## 2016-12-13 NOTE — ED Triage Notes (Signed)
C/o bladder infection for 5 days. Currently taking Cipro. Has been taking it 3x's a day instead of 2. A/O. NAD.

## 2016-12-13 NOTE — Discharge Instructions (Signed)
Please return with fever, nausea, vomiting, back pain or other concerns.

## 2016-12-13 NOTE — ED Provider Notes (Signed)
[facility] DEPT [facility] Provider Note   CSN: [id number] Arrival date & time: 12/13/16  6301     History   Chief Complaint Chief Complaint  Patient presents with  . Urinary Tract Infection    HPI Christopher Goodwin is a 71 y.o. male.  HPI   Very well-appearing 71 year old male presenting with urinary tract symptoms. Patient had urinary tract symptoms last 4-5 days. He was started on Cipro by outpatient provider. This is at [facility]. It does not appear the urine was sent for culture. We'll send it for culture today. Patient reports also some difficulty starting and stopping urination. This is been going on for a longer period of time. Patient did notice blood in his urine earlier this week that since resolved. Patient has no systemic symptoms such as fever, nausea, vomiting, or flank or abdominal pain.  Past Medical History:  Diagnosis Date  . Arthritis   . Cardiomyopathy, dilated, nonischemic ([place])    by 2008 cath and 2013 stress test results; as of 12/28/14: has been followed by Dr. Bernardo Heater in [place] (last seen 11/2014), moved to [facility] 12/2014  . Complication of anesthesia    woke up to early  . Dilated aortic root (HCC)    4.9 cm by 05/26/14 echo [facility], Dr. Donnal Debar)  . Dysrhythmia    AF    Patient Active Problem List   Diagnosis Date Noted  . Spinal stenosis at L4-L5 level 01/03/2015    Past Surgical History:  Procedure Laterality Date  . CARDIAC CATHETERIZATION     04/10/06 [facility]): Mild plaquing w/o obstructive CAD, LVEF 40-45%, normal right heart pressures.   Marland Kitchen CARDIOVASCULAR STRESS TEST     02/27/12: Nuclear stress test showed no ischemia, mild LV dysfunction, EF 45%. Images c/w non-ischemic dilated cardiomyopathy. (Dr. Bernardo Heater)  . JOINT REPLACEMENT     2 knee tk  . LUMBAR LAMINECTOMY/DECOMPRESSION MICRODISCECTOMY Left 01/03/2015   Procedure: Lumbar four, lumbar five left Laminectomy and Foraminotomy ;   Surgeon: Kary Kos, MD;  Location: [place] [facility]UR[facility] ORS;  Service: Neurosurgery;  Laterality: Left;  . US ECHOCARDIOGRAPHY     05/26/14 Echo [facility]): Mild-mod global LV dysfunction, EF 40-45%, moderately dilated aortic root at 4.9 cm with mild AR, mild LAE, afib.       Home Medications    Prior to Admission medications   Medication Sig Start Date End Date Taking? Authorizing Provider  atenolol (TENORMIN) 50 MG tablet Take 50 mg by mouth daily.   Yes [provider]  Cholecalciferol (VITAMIN D) 2000 UNITS CAPS Take 1 capsule by mouth daily.   Yes [provider]  Cyanocobalamin 1000 MCG/ML KIT Inject 1 mL as directed every 30 (thirty) days.   Yes [provider]  folic acid (FOLVITE) 1 MG tablet Take 1 mg by mouth daily.   Yes [provider]  warfarin (COUMADIN) 6 MG tablet Take 6 mg by mouth daily.   Yes [provider]  HYDROcodone-acetaminophen (NORCO/VICODIN) 5-325 MG tablet Take 1-2 tablets by mouth every 4 (four) hours as needed (mild pain). 01/03/15   Kary Kos, MD    Family History No family history on file.  Social History Social History  Substance Use Topics  . Smoking status: Current Every Day Smoker    Types: Cigars  . Smokeless tobacco: Never Used  . Alcohol use Yes     Comment: beer occ     Allergies   Patient has no  known allergies.   Review of Systems Review of Systems  Constitutional: Negative for activity change, fatigue and fever.  Respiratory: Negative for shortness of breath.   Cardiovascular: Negative for chest pain.  Gastrointestinal: Negative for abdominal pain.  Genitourinary: Negative for dysuria.     Physical Exam Updated Vital Signs BP 97/67 (BP Location: Right Arm) Comment: Pt states this is a normal BP for him.  Pulse 68   Temp 98.2 F (36.8 C) (Oral)   Resp 18   Ht '6\' 2"'  (1.88 m)   Wt 120.2 kg (265 lb)   SpO2 98%   BMI 34.02 kg/m   Physical Exam    Constitutional: He is oriented to person, place, and time. He appears well-nourished.  HENT:  Head: Normocephalic.  Eyes: Conjunctivae are normal.  Cardiovascular: Normal rate.   Pulmonary/Chest: Effort normal and breath sounds normal. No respiratory distress.  Abdominal: Soft. Bowel sounds are normal. He exhibits no distension. There is no tenderness.  Neurological: He is oriented to person, place, and time.  Skin: Skin is warm and dry. He is not diaphoretic.  Psychiatric: He has a normal mood and affect. His behavior is normal.     ED Treatments / Results  Labs (all labs ordered are listed, but only abnormal results are displayed) Labs Reviewed  URINE CULTURE  URINALYSIS, ROUTINE W REFLEX MICROSCOPIC    EKG  EKG Interpretation None       Radiology No results found.  Procedures Procedures (including critical care time)  Medications Ordered in ED Medications  cephALEXin (KEFLEX) capsule 500 mg (not administered)     Initial Impression / Assessment and Plan / ED Course  I have reviewed the triage vital signs and the nursing notes.  Pertinent labs & imaging results that were available during my care of the patient were reviewed by me and considered in my medical decision making (see chart for details).     Very well-appearing 71 year old male presenting with urinary tract symptoms. Patient had urinary tract symptoms last 4-5 days. He was started on Cipro by outpatient provider. This is at [facility]. It does not appear the urine was sent for culture. We'll send it for culture today. Patient reports also some difficulty starting and stopping urination. This is been going on for a longer period of time. Patient did notice blood in his urine earlier this week that since resolved. Patient has no systemic symptoms such as fever, nausea, vomiting, or flank or abdominal pain.  9:52 AM We'll send urine for culture. We'll give Keflex. We'll have him follow-up with [facility] to make sure that is the appropriate antibiotic and culture. We will also give him urology follow-up given that troubles initiating and stopping stream, could be indicative of enlarging prostate.  Final Clinical Impressions(s) / ED Diagnoses   Final diagnoses:  None    New Prescriptions New Prescriptions   No medications on file     Macarthur Critchley, MD 12/13/16 [phone]

## 2016-12-13 NOTE — ED Notes (Signed)
Pt verbalized understanding of discharge instructions and denies any further questions at this time.   

## 2016-12-13 NOTE — ED Notes (Signed)
Denies N/V/F.

## 2016-12-15 LAB — URINE CULTURE

## 2016-12-16 ENCOUNTER — Telehealth: Payer: Self-pay | Admitting: *Deleted

## 2016-12-16 NOTE — Telephone Encounter (Signed)
Post ED Visit - Positive Culture Follow-up  Culture report reviewed by antimicrobial stewardship pharmacist:  []  Christopher Goodwin, Pharm.D. []  Christopher Goodwin, Pharm.D., BCPS AQ-ID []  Christopher Goodwin, Pharm.D., BCPS []  Christopher Goodwin, [address] BCPS []  Christopher Goodwin, [address] BCPS, AAHIVP [x]  Christopher Goodwin, Pharm.D., BCPS, AAHIVP []  Christopher Goodwin, PharmD, BCPS []  Christopher Goodwin, PharmD, BCPS []  Christopher Goodwin, PharmD, BCPS  Positive urine culture Treated with Cephalexin, organism sensitive to the same and no further patient follow-up is required at this time.  Christopher Goodwin, Christopher Goodwin 12/16/2016, 12:20 PM

## 2017-01-14 ENCOUNTER — Other Ambulatory Visit: Payer: Self-pay | Admitting: Urology

## 2017-01-23 ENCOUNTER — Encounter (HOSPITAL_BASED_OUTPATIENT_CLINIC_OR_DEPARTMENT_OTHER): Payer: Self-pay | Admitting: *Deleted

## 2017-01-23 NOTE — Progress Notes (Addendum)
NPO AFTER MN.  ARRIVE AT 0830.  GETTING  LAB WORK DONE Wednesday 01-28-2017 OR Thursday 01-29-2017 (CBC, BMET, PT/INR).  REVIEWED [facility] GUIDELINES, WILL BRING MEDS.  CURRENT EKG, LOV NOTE AND ANY CARDIAC TEST TO BE FAXED FROM DR Jacinto HalimGANJI.

## 2017-01-27 ENCOUNTER — Encounter (HOSPITAL_BASED_OUTPATIENT_CLINIC_OR_DEPARTMENT_OTHER): Payer: Self-pay | Admitting: *Deleted

## 2017-01-29 DIAGNOSIS — N401 Enlarged prostate with lower urinary tract symptoms: Secondary | ICD-10-CM | POA: Diagnosis not present

## 2017-01-29 DIAGNOSIS — R338 Other retention of urine: Secondary | ICD-10-CM | POA: Diagnosis not present

## 2017-01-29 DIAGNOSIS — N390 Urinary tract infection, site not specified: Secondary | ICD-10-CM | POA: Diagnosis not present

## 2017-01-29 DIAGNOSIS — R3912 Poor urinary stream: Secondary | ICD-10-CM | POA: Diagnosis not present

## 2017-01-29 DIAGNOSIS — R3914 Feeling of incomplete bladder emptying: Secondary | ICD-10-CM | POA: Diagnosis not present

## 2017-01-29 DIAGNOSIS — I4891 Unspecified atrial fibrillation: Secondary | ICD-10-CM | POA: Diagnosis not present

## 2017-01-29 DIAGNOSIS — Z7901 Long term (current) use of anticoagulants: Secondary | ICD-10-CM | POA: Diagnosis not present

## 2017-01-29 DIAGNOSIS — Z79899 Other long term (current) drug therapy: Secondary | ICD-10-CM | POA: Diagnosis not present

## 2017-01-29 LAB — CBC
HEMATOCRIT: 49 % (ref 39.0–52.0)
HEMOGLOBIN: 16.6 g/dL (ref 13.0–17.0)
MCH: 31 pg (ref 26.0–34.0)
MCHC: 33.9 g/dL (ref 30.0–36.0)
MCV: 91.4 fL (ref 78.0–100.0)
Platelets: 172 10*3/uL (ref 150–400)
RBC: 5.36 MIL/uL (ref 4.22–5.81)
RDW: 13.1 % (ref 11.5–15.5)
WBC: 7.5 10*3/uL (ref 4.0–10.5)

## 2017-01-29 LAB — BASIC METABOLIC PANEL
ANION GAP: 10 (ref 5–15)
BUN: 16 mg/dL (ref 6–20)
CHLORIDE: 105 mmol/L (ref 101–111)
CO2: 26 mmol/L (ref 22–32)
Calcium: 8.9 mg/dL (ref 8.9–10.3)
Creatinine, Ser: 1.14 mg/dL (ref 0.61–1.24)
GFR calc Af Amer: >60 mL/min (ref >60)
GFR calc non Af Amer: >60 mL/min (ref >60)
GLUCOSE: 105 mg/dL — AB (ref 65–99)
POTASSIUM: 4.1 mmol/L (ref 3.5–5.1)
Sodium: 141 mmol/L (ref 135–145)

## 2017-01-29 LAB — PROTIME-INR
INR: 1.06
Prothrombin Time: 13.7 seconds (ref 11.4–15.2)

## 2017-01-29 NOTE — Anesthesia Preprocedure Evaluation (Signed)
Anesthesia Evaluation  Patient identified by MRN, date of birth, ID band Patient awake    Reviewed: Allergy & Precautions, NPO status , Patient's Chart, lab work & pertinent test results, reviewed documented beta blocker date and time   History of Anesthesia Complications Negative for: history of anesthetic complications  Airway Mallampati: II  TM Distance: >3 FB Neck ROM: Full    Dental no notable dental hx.    Pulmonary Current Smoker, former smoker,    Pulmonary exam normal breath sounds clear to auscultation       Cardiovascular + Peripheral Vascular Disease  + dysrhythmias Atrial Fibrillation + Valvular Problems/Murmurs AI  Rhythm:Irregular Rate:Normal  EF 40-45%. Global hypokinesis.    Neuro/Psych negative neurological ROS  negative psych ROS   GI/Hepatic negative GI ROS, Neg liver ROS,   Endo/Other  negative endocrine ROS  Renal/GU negative Renal ROS     Musculoskeletal  (+) Arthritis ,   Abdominal   Peds  Hematology negative hematology ROS (+)   Anesthesia Other Findings Cardiomyopathy, dilated, nonischemic    4.9 cm by 05/26/14 echo  Thoracic ascending aortic aneurysm    EKG 9/16  Atrial fibrillation with premature ventricular or aberrantly conducted complexes   Reproductive/Obstetrics                             Anesthesia Physical  Anesthesia Plan  ASA: III  Anesthesia Plan: General   Post-op Pain Management:    Induction: Intravenous  PONV Risk Score and Plan:   Airway Management Planned: Oral ETT and LMA  Additional Equipment: None  Intra-op Plan:   Post-operative Plan: Extubation in OR  Informed Consent: I have reviewed the patients History and Physical, chart, labs and discussed the procedure including the risks, benefits and alternatives for the proposed anesthesia with the patient or authorized representative who has indicated his/her understanding and  acceptance.   Dental advisory given  Plan Discussed with: CRNA  Anesthesia Plan Comments:         Anesthesia Quick Evaluation

## 2017-01-29 NOTE — H&P (Signed)
I was consulted by the above provider to assist the patient's recent urinary tract infection. He was having trouble to urinate and just felt poorly and was started on 5 days of ciprofloxacin. He went the emergency room yesterday and was started on Keflex. He did have a positive culture now sensitive to Keflex. The urine was a bit orange color and a bit foul-smelling   At baseline he has a poor to reasonable flow. In the last 5 or 6 months he has had 2 episodes of bedwetting but other nights he will leak a small amount not wearing a pad. Otherwise he is dry during the day   He voids every 60-90 minutes and gets up 3 times a night. He does not take Lasix and he denies ankle edema   The patient is urine still looked positive and I sent it for culture. He was scanned for 1 L. He feels comfortable. Pathophysiology of poor flow was discussed. I would like to have him come back with a CT urogram for hydronephrosis.   The more I thought about it I did the following. We put a 16 French catheter in. We will call in Flomax 0.4 mg daily with 30 tablets 11 refills. He will see our nurse practitioner for an early trial of voiding in one week on Monday or Tuesday. He will get a CT stone protocol for hydronephrosis on the same day. He will come back that afternoon to complete his trial of voiding to her. Pathophysiology of retention was discussed. He may need urodynamics and cystoscopy in the future.   He was catheterized for 1700 mL likely representing a chronic process   Bree: The patient had an unsuccessful trial of voiding with a residual of approximately 1400 mL and he was becoming uncomfortable urodynamics were ordered. He had no hydronephrosis. He increased his tamsulosin to 0.8 mg. Urine culture was positive and treated with Macrodantin   On urodynamics he did not void. The bladder capacity was approximately 1800 mL. His bladder was hyposensitive. He did not have stress incontinence generating a pressure of  137 cm of water. His bladder was stable. During voluntary voiding he voided 201 L. The maximum flow was 9 mils per second. He was generating a contraction well before the flow started. Maximum voiding pressure is 36 cm of water. Residual was 1600 mL. The contraction was well sustained. Bladder was heavily trabeculated   Cystoscopy. The penile and bulbar urethra were normal. He had bilobar enlargement of the prostate. His prostatic urethra was approximately 2.5 cm in length. On bladder examination he is a little bit of redness at the back of the bladder from the Foley catheter. He had mild bladder trabeculation. Trigone was normal.   The patient is on Coumadin for atrial fibrillation.   The patient has a large capacity bladder. He is arguably urodynamically obstructed. Pros cons and risk of a transurethral resection of the prostate were discussed. I believe the efficacy to free him from a catheter may be only 30 or 40%. The balance may have been tipped. He likely will still have elevated residuals but hopefully would be able to void without a catheter. Pros and cons and risks discussed not limited to hematuria and sequelae, urethral stricture disease and bladder neck contracture, erectile dysfunction, retrograde ejaculation, urinary incontinence. Cardiac events relative to atrial fibrillation and anticoagulation also discussed. He would need to be off his blood thinners. He may have trouble voiding the day after surgery. He may need to go  home with a catheter for a few days. The role of a long-term trial of voiding and finasteride discussed. This likely represents an acute on chronic process. His urine would need to be sterilized preoperatively. The role of short and long-term clean intermittent catheterization also discussed     ALLERGIES: None    MEDICATIONS: Macrodantin 100 mg capsule 1 capsule PO BID  Warfarin Sodium 6 mg tablet  Atenolol  Flomax 0.4 mg capsule, ext release 24 hr 1 capsule PO Daily   Folic Acid  Vitamin B-12     GU PSH: Complex cystometrogram, w/ void pressure and urethral pressure profile studies, any technique - 12/25/2016 Complex Uroflow - 12/25/2016 Emg surf Electrd - 12/25/2016 Inject For cystogram - 12/25/2016 Intrabd voidng Press - 12/25/2016      PSH Notes: spine  knee replacement (both)     NON-GU PSH: None   GU PMH: Incomplete bladder emptying - 12/15/2016 Nocturia - 12/15/2016 Urinary Frequency - 12/15/2016 Weak Urinary Stream - 12/15/2016    NON-GU PMH: Arrhythmia Arthritis    FAMILY HISTORY: Death - Father, Mother   SOCIAL HISTORY: Marital Status: Married Preferred Language: English; Ethnicity: Not Hispanic Or Latino; Race: White Current Smoking Status: Patient has never smoked.   Tobacco Use Assessment Completed: Used Tobacco in last 30 days? Does not use smokeless tobacco. Social Drinker.  Does not use drugs. Drinks 2 caffeinated drinks per day. Has not had a blood transfusion. Patient's occupation is/was Retired.    REVIEW OF SYSTEMS:    GU Review Male:   Patient reports trouble starting your stream and have to strain to urinate . Patient denies frequent urination, hard to postpone urination, burning/ pain with urination, get up at night to urinate, leakage of urine, stream starts and stops, erection problems, and penile pain.  Gastrointestinal (Upper):   Patient denies nausea, vomiting, and indigestion/ heartburn.  Gastrointestinal (Lower):   Patient denies diarrhea and constipation.  Constitutional:   Patient denies fever, night sweats, weight loss, and fatigue.  Skin:   Patient denies skin rash/ lesion and itching.  Eyes:   Patient denies blurred vision and double vision.  Ears/ Nose/ Throat:   Patient denies sore throat and sinus problems.  Hematologic/Lymphatic:   Patient denies swollen glands and easy bruising.  Cardiovascular:   Patient denies leg swelling and chest pains.  Respiratory:   Patient denies cough and shortness of  breath.  Endocrine:   Patient denies excessive thirst.  Musculoskeletal:   Patient denies back pain and joint pain.  Neurological:   Patient denies headaches and dizziness.  Psychologic:   Patient denies depression and anxiety.   VITAL SIGNS: None   PAST DATA REVIEWED:  Source Of History:  Patient   PROCEDURES:         Flexible Cystoscopy - 52000  Risks, benefits, and some of the potential complications of the procedure were discussed at length with the patient. All questions were answered. Informed consent was obtained. Sterile technique and intraurethral analgesia were used.  Prostate:  The penile and bulbar urethra were normal. He had bilobar enlargement of the prostate. His prostatic urethra was approximately 2.5 cm in length. On bladder examination he is a little bit of redness at the back of the bladder from the Foley catheter. He had mild bladder trabeculation. Trigone was normal.      The lower urinary tract was carefully examined. The procedure was well-tolerated and without complications. Instructions were given to call the office immediately for bloody urine, difficulty urinating,  urinary retention, painful or frequent urination, fever, chills, nausea, vomiting or other illness. The patient stated that he understood these instructions and would comply with them.           Voiding Trial - 78295  Instilled Volume: 350 cc         Catheter / SP Tube - 51702 Simple Foley Catheterization  A 14 French Foley catheter was inserted into the bladder using sterile technique. The patient was taught routine catheter care. A bedside bag was connected.   ASSESSMENT:      ICD-10 Details  1 GU:   Incomplete bladder emptying - R39.14   2   Weak Urinary Stream - R39.12           Notes:   The patient came back and could not void. He was catheterized for more than 900 mL. I did send his urine for culture and he is just finishing his few days of antibiotics. He asked me and I thought it was very  reasonable under the circumstances as of fairly active gentleman to undergo a transurethral resection of the prostate. Multiple questions were answered. Retrograde ejaculation was also discussed. We would need cardiac clearance. We would need direction on blood thinners. I'm going to give him a 10 day course of keflex based on previous culture. He would not fill it until we call him with directions. He will get a urine culture well before surgery. I will start an antibiotic 5 days before and 5 days afterwards recognizing potential for elevated residuals post procedure. Usual postoperative course was discussed as well  After a thorough review of the management options for the patient's condition the patient  elected to proceed with surgical therapy as noted above. We have discussed the potential benefits and risks of the procedure, side effects of the proposed treatment, the likelihood of the patient achieving the goals of the procedure, and any potential problems that might occur during the procedure or recuperation. Informed consent has been obtained.

## 2017-01-30 ENCOUNTER — Observation Stay (HOSPITAL_BASED_OUTPATIENT_CLINIC_OR_DEPARTMENT_OTHER)
Admission: RE | Admit: 2017-01-30 | Discharge: 2017-01-31 | Disposition: A | Discharge status: Home / Self Care | Payer: Medicare Other | Source: Ambulatory Visit | Attending: Urology | Admitting: Urology

## 2017-01-30 ENCOUNTER — Encounter (HOSPITAL_BASED_OUTPATIENT_CLINIC_OR_DEPARTMENT_OTHER): Payer: Self-pay | Admitting: *Deleted

## 2017-01-30 ENCOUNTER — Ambulatory Visit (HOSPITAL_BASED_OUTPATIENT_CLINIC_OR_DEPARTMENT_OTHER): Payer: Medicare Other | Admitting: Anesthesiology

## 2017-01-30 ENCOUNTER — Encounter (HOSPITAL_BASED_OUTPATIENT_CLINIC_OR_DEPARTMENT_OTHER)
Admission: RE | Disposition: A | Discharge status: Home / Self Care | Payer: Self-pay | Source: Ambulatory Visit | Attending: Urology

## 2017-01-30 DIAGNOSIS — I4891 Unspecified atrial fibrillation: Secondary | ICD-10-CM | POA: Insufficient documentation

## 2017-01-30 DIAGNOSIS — R3914 Feeling of incomplete bladder emptying: Secondary | ICD-10-CM | POA: Insufficient documentation

## 2017-01-30 DIAGNOSIS — N390 Urinary tract infection, site not specified: Secondary | ICD-10-CM | POA: Diagnosis not present

## 2017-01-30 DIAGNOSIS — N401 Enlarged prostate with lower urinary tract symptoms: Principal | ICD-10-CM | POA: Insufficient documentation

## 2017-01-30 DIAGNOSIS — R339 Retention of urine, unspecified: Secondary | ICD-10-CM | POA: Diagnosis present

## 2017-01-30 DIAGNOSIS — R338 Other retention of urine: Secondary | ICD-10-CM | POA: Diagnosis not present

## 2017-01-30 DIAGNOSIS — Z79899 Other long term (current) drug therapy: Secondary | ICD-10-CM | POA: Insufficient documentation

## 2017-01-30 DIAGNOSIS — R3912 Poor urinary stream: Secondary | ICD-10-CM | POA: Insufficient documentation

## 2017-01-30 DIAGNOSIS — Z7901 Long term (current) use of anticoagulants: Secondary | ICD-10-CM | POA: Insufficient documentation

## 2017-01-30 HISTORY — DX: Benign prostatic hyperplasia without lower urinary tract symptoms: N40.0

## 2017-01-30 HISTORY — DX: Presence of other specified devices: Z97.8

## 2017-01-30 HISTORY — DX: Long term (current) use of anticoagulants: Z79.01

## 2017-01-30 HISTORY — DX: Thoracic aortic aneurysm, without rupture: I71.2

## 2017-01-30 HISTORY — DX: Presence of spectacles and contact lenses: Z97.3

## 2017-01-30 HISTORY — DX: Aneurysm of the ascending aorta, without rupture: I71.21

## 2017-01-30 HISTORY — DX: Presence of urogenital implants: Z96.0

## 2017-01-30 HISTORY — DX: Retention of urine, unspecified: R33.9

## 2017-01-30 HISTORY — DX: Chronic atrial fibrillation, unspecified: I48.20

## 2017-01-30 HISTORY — PX: CYSTOSCOPY: SHX5120

## 2017-01-30 HISTORY — PX: TRANSURETHRAL RESECTION OF PROSTATE: SHX73

## 2017-01-30 SURGERY — TURP (TRANSURETHRAL RESECTION OF PROSTATE)
Anesthesia: General

## 2017-01-30 MED ORDER — DEXAMETHASONE SODIUM PHOSPHATE 10 MG/ML IJ SOLN
INTRAMUSCULAR | Status: DC | PRN
  Administered 2017-01-30: 10 mg via INTRAVENOUS

## 2017-01-30 MED ORDER — FOLIC ACID 1 MG PO TABS
1.0000 mg | ORAL_TABLET | Freq: Every day | ORAL | Status: DC
  Administered 2017-01-30: 1 mg via ORAL
  Filled 2017-01-30 (×2): qty 1

## 2017-01-30 MED ORDER — PHENYLEPHRINE 40 MCG/ML (10ML) SYRINGE FOR IV PUSH (FOR BLOOD PRESSURE SUPPORT)
PREFILLED_SYRINGE | INTRAVENOUS | Status: DC | PRN
Start: 2017-01-30 — End: 2017-01-30
  Administered 2017-01-30: 80 ug via INTRAVENOUS
  Administered 2017-01-30: 40 ug via INTRAVENOUS
  Administered 2017-01-30 (×4): 80 ug via INTRAVENOUS
  Administered 2017-01-30: 40 ug via INTRAVENOUS

## 2017-01-30 MED ORDER — LACTATED RINGERS IV SOLN
INTRAVENOUS | Status: DC
  Administered 2017-01-30 (×2): via INTRAVENOUS
  Filled 2017-01-30: qty 1000

## 2017-01-30 MED ORDER — PROPOFOL 10 MG/ML IV BOLUS
INTRAVENOUS | Status: DC | PRN
  Administered 2017-01-30: 200 mg via INTRAVENOUS

## 2017-01-30 MED ORDER — ONDANSETRON HCL 4 MG/2ML IJ SOLN
INTRAMUSCULAR | Status: AC
  Filled 2017-01-30: qty 2

## 2017-01-30 MED ORDER — PHENYLEPHRINE 40 MCG/ML (10ML) SYRINGE FOR IV PUSH (FOR BLOOD PRESSURE SUPPORT)
PREFILLED_SYRINGE | INTRAVENOUS | Status: AC
  Filled 2017-01-30: qty 10

## 2017-01-30 MED ORDER — MORPHINE SULFATE (PF) 2 MG/ML IV SOLN
2.0000 mg | INTRAVENOUS | Status: DC | PRN
  Filled 2017-01-30: qty 2

## 2017-01-30 MED ORDER — FENTANYL CITRATE (PF) 250 MCG/5ML IJ SOLN
INTRAMUSCULAR | Status: AC
  Filled 2017-01-30: qty 5

## 2017-01-30 MED ORDER — LIDOCAINE 2% (20 MG/ML) 5 ML SYRINGE
INTRAMUSCULAR | Status: AC
  Filled 2017-01-30: qty 5

## 2017-01-30 MED ORDER — SODIUM CHLORIDE 0.9 % IR SOLN
Status: DC | PRN
  Administered 2017-01-30: 15000 mL

## 2017-01-30 MED ORDER — FENTANYL CITRATE (PF) 100 MCG/2ML IJ SOLN
INTRAMUSCULAR | Status: DC | PRN
  Administered 2017-01-30: 50 ug via INTRAVENOUS

## 2017-01-30 MED ORDER — LIDOCAINE 2% (20 MG/ML) 5 ML SYRINGE
INTRAMUSCULAR | Status: DC | PRN
  Administered 2017-01-30: 80 mg via INTRAVENOUS

## 2017-01-30 MED ORDER — CYANOCOBALAMIN 1000 MCG/ML IJ KIT: 1.0000 mL | PACK | INTRAMUSCULAR | Status: DC

## 2017-01-30 MED ORDER — PROPOFOL 10 MG/ML IV BOLUS
INTRAVENOUS | Status: AC
  Filled 2017-01-30: qty 40

## 2017-01-30 MED ORDER — VITAMIN D 1000 UNITS PO TABS
2000.0000 [IU] | ORAL_TABLET | Freq: Every day | ORAL | Status: DC
  Administered 2017-01-30: 2000 [IU] via ORAL
  Filled 2017-01-30 (×2): qty 2

## 2017-01-30 MED ORDER — MEPERIDINE HCL 25 MG/ML IJ SOLN
6.2500 mg | INTRAMUSCULAR | Status: DC | PRN
  Filled 2017-01-30: qty 1

## 2017-01-30 MED ORDER — ONDANSETRON HCL 4 MG/2ML IJ SOLN
INTRAMUSCULAR | Status: DC | PRN
  Administered 2017-01-30: 4 mg via INTRAVENOUS

## 2017-01-30 MED ORDER — CEFAZOLIN SODIUM-DEXTROSE 2-4 GM/100ML-% IV SOLN
2.0000 g | INTRAVENOUS | Status: AC
  Administered 2017-01-30: 2 g via INTRAVENOUS
  Filled 2017-01-30: qty 100

## 2017-01-30 MED ORDER — FENTANYL CITRATE (PF) 100 MCG/2ML IJ SOLN
25.0000 ug | INTRAMUSCULAR | Status: DC | PRN
  Filled 2017-01-30: qty 1

## 2017-01-30 MED ORDER — DEXAMETHASONE SODIUM PHOSPHATE 10 MG/ML IJ SOLN
INTRAMUSCULAR | Status: AC
  Filled 2017-01-30: qty 1

## 2017-01-30 MED ORDER — CYANOCOBALAMIN 1000 MCG/ML IJ SOLN
1000.0000 ug | INTRAMUSCULAR | Status: DC
  Filled 2017-01-30 (×2): qty 1

## 2017-01-30 MED ORDER — GENTAMICIN SULFATE 40 MG/ML IJ SOLN
5.0000 mg/kg | INTRAVENOUS | Status: AC
  Administered 2017-01-30: 490 mg via INTRAVENOUS
  Filled 2017-01-30: qty 12.25

## 2017-01-30 MED ORDER — CEFAZOLIN SODIUM-DEXTROSE 2-4 GM/100ML-% IV SOLN
INTRAVENOUS | Status: AC
  Filled 2017-01-30: qty 100

## 2017-01-30 MED ORDER — ATENOLOL 50 MG PO TABS
50.0000 mg | ORAL_TABLET | Freq: Every evening | ORAL | Status: DC
Start: 2017-01-30 — End: 2017-01-31
  Administered 2017-01-30: 50 mg via ORAL
  Filled 2017-01-30 (×2): qty 1

## 2017-01-30 MED ORDER — CEPHALEXIN 500 MG PO CAPS
500.0000 mg | ORAL_CAPSULE | Freq: Three times a day (TID) | ORAL | Status: DC
  Administered 2017-01-30 (×2): 500 mg via ORAL
  Filled 2017-01-30 (×3): qty 1

## 2017-01-30 MED ORDER — ACETAMINOPHEN 325 MG PO TABS
650.0000 mg | ORAL_TABLET | ORAL | Status: DC | PRN
  Filled 2017-01-30: qty 2

## 2017-01-30 MED ORDER — ONDANSETRON HCL 4 MG/2ML IJ SOLN
4.0000 mg | INTRAMUSCULAR | Status: DC | PRN
  Filled 2017-01-30: qty 2

## 2017-01-30 MED ORDER — GENTAMICIN SULFATE 40 MG/ML IJ SOLN
5.0000 mg/kg | INTRAVENOUS | Status: DC
  Filled 2017-01-30: qty 15

## 2017-01-30 MED ORDER — HYDROCODONE-ACETAMINOPHEN 5-325 MG PO TABS
1.0000 | ORAL_TABLET | ORAL | Status: DC | PRN
  Filled 2017-01-30: qty 2

## 2017-01-30 MED ORDER — POTASSIUM CHLORIDE IN NACL 20-0.9 MEQ/L-% IV SOLN
INTRAVENOUS | Status: DC
  Administered 2017-01-30: 22:00:00 via INTRAVENOUS
  Filled 2017-01-30 (×3): qty 1000

## 2017-01-30 SURGICAL SUPPLY — 43 items
BAG DRAIN URO-CYSTO SKYTR STRL (DRAIN) ×2 IMPLANT
BAG URINE DRAINAGE (UROLOGICAL SUPPLIES) IMPLANT
BAG URINE LEG 19OZ MD ST LTX (BAG) IMPLANT
BALLN NEPHROSTOMY (BALLOONS)
BALLOON NEPHROSTOMY (BALLOONS) IMPLANT
BLADE SURG 15 STRL LF DISP TIS (BLADE) IMPLANT
BLADE SURG 15 STRL SS (BLADE)
CATH AINSWORTH 30CC 24FR (CATHETERS) IMPLANT
CATH BONANNO SUPRAPUBIC 14G (CATHETERS) IMPLANT
CATH FOLEY 2WAY SLVR  5CC 20FR (CATHETERS)
CATH FOLEY 2WAY SLVR 5CC 20FR (CATHETERS) IMPLANT
CATH HEMA 3WAY 30CC 24FR COUDE (CATHETERS) IMPLANT
CATH HEMA 3WAY 30CC 24FR RND (CATHETERS) IMPLANT
CATH ROBINSON RED A/P 14FR (CATHETERS) IMPLANT
CATH SIMPLASTIC 24 30ML (CATHETERS) ×2 IMPLANT
CATH URET 5FR 28IN CONE TIP (BALLOONS)
CATH URET 5FR 70CM CONE TIP (BALLOONS) IMPLANT
CLOTH BEACON ORANGE TIMEOUT ST (SAFETY) ×2 IMPLANT
ELECT REM PT RETURN 9FT ADLT (ELECTROSURGICAL)
ELECTRODE REM PT RTRN 9FT ADLT (ELECTROSURGICAL) IMPLANT
EVACUATOR MICROVAS BLADDER (UROLOGICAL SUPPLIES) ×2 IMPLANT
GLOVE BIO SURGEON STRL SZ7.5 (GLOVE) ×2 IMPLANT
GOWN STRL REUS W/ TWL LRG LVL3 (GOWN DISPOSABLE) ×1 IMPLANT
GOWN STRL REUS W/ TWL XL LVL3 (GOWN DISPOSABLE) ×1 IMPLANT
GOWN STRL REUS W/TWL LRG LVL3 (GOWN DISPOSABLE) ×1
GOWN STRL REUS W/TWL XL LVL3 (GOWN DISPOSABLE) ×1
GUIDEWIRE STR DUAL SENSOR (WIRE) ×2 IMPLANT
HOLDER FOLEY CATH W/STRAP (MISCELLANEOUS) IMPLANT
IV NS IRRIG 3000ML ARTHROMATIC (IV SOLUTION) ×10 IMPLANT
KIT RM TURNOVER CYSTO AR (KITS) ×2 IMPLANT
MANIFOLD NEPTUNE II (INSTRUMENTS) ×2 IMPLANT
NDL SAFETY ECLIPSE 18X1.5 (NEEDLE) IMPLANT
NEEDLE HYPO 18GX1.5 SHARP (NEEDLE)
NS IRRIG 500ML POUR BTL (IV SOLUTION) ×4 IMPLANT
PACK CYSTO (CUSTOM PROCEDURE TRAY) ×2 IMPLANT
PLUG CATH AND CAP STER (CATHETERS) IMPLANT
SET ASPIRATION TUBING (TUBING) IMPLANT
SYR 20CC LL (SYRINGE) IMPLANT
SYR 30ML LL (SYRINGE) ×2 IMPLANT
SYRINGE IRR TOOMEY STRL 70CC (SYRINGE) ×2 IMPLANT
TUBE CONNECTING 12X1/4 (SUCTIONS) ×2 IMPLANT
WATER STERILE IRR 1000ML POUR (IV SOLUTION) ×2 IMPLANT
WATER STERILE IRR 3000ML UROMA (IV SOLUTION) IMPLANT

## 2017-01-30 NOTE — Transfer of Care (Signed)
Immediate Anesthesia Transfer of Care Note  Patient: Christopher SagesJulian W Goodwin  Procedure(s) Performed: TRANSURETHRAL RESECTION OF THE PROSTATE (TURP) (N/A ) CYSTOSCOPY (N/A )  Patient Location: PACU  Anesthesia Type:General  Level of Consciousness: awake, alert  and oriented  Airway & Oxygen Therapy: Patient Spontanous Breathing and Patient connected to nasal cannula oxygen  Post-op Assessment: Report given to RN  Post vital signs: Reviewed and stable  Last Vitals: 98/68, 60, 13, 96%, 97.4 Vitals:   01/30/17 0836  BP: (!) 85/71  Pulse: 71  Resp: 17  Temp: 36.6 C  SpO2: 99%    Last Pain:  Vitals:   01/30/17 0836  TempSrc: Oral      Patients Stated Pain Goal: 6 (01/30/17 0907)  Complications: No apparent anesthesia complications

## 2017-01-30 NOTE — Interval H&P Note (Signed)
History and Physical Interval Note:  01/30/2017 10:32 AM  Christopher Goodwin  has presented today for surgery, with the diagnosis of URINARY RETENTION  The various methods of treatment have been discussed with the patient and family. After consideration of risks, benefits and other options for treatment, the patient has consented to  Procedure(s): TRANSURETHRAL RESECTION OF THE PROSTATE (TURP) (N/A) CYSTOSCOPY (N/A) as a surgical intervention .  The patient's history has been reviewed, patient examined, no change in status, stable for surgery.  I have reviewed the patient's chart and labs.  Questions were answered to the patient's satisfaction.     Shanan Mcmiller A

## 2017-01-30 NOTE — Progress Notes (Signed)
Pt. Doing well post-operatively. VSS and UOP adequate. CBI continues with clear urine no clots, mild cherry pink color.  Pt. Has OOB 1x and walked hall w/o complication.  Tolerating regular diet w/o n/v.  No complaints of pain.  Dr. Sherron MondayMacDiarmid saw pt. Post operatively and noted pt. Will go home with foley, and restart Warfrin on Monday.  Pt. To revisit clinic on Tues. And have foley removed.  Will continue to monitor.

## 2017-01-30 NOTE — Anesthesia Postprocedure Evaluation (Signed)
Anesthesia Post Note  Patient: Christopher SagesJulian W Goodwin  Procedure(s) Performed: TRANSURETHRAL RESECTION OF THE PROSTATE (TURP) (N/A ) CYSTOSCOPY (N/A )     Patient location during evaluation: PACU Anesthesia Type: General Level of consciousness: awake and alert Pain management: pain level controlled Vital Signs Assessment: post-procedure vital signs reviewed and stable Respiratory status: spontaneous breathing, nonlabored ventilation, respiratory function stable and patient connected to nasal cannula oxygen Cardiovascular status: blood pressure returned to baseline and stable Postop Assessment: no apparent nausea or vomiting Anesthetic complications: no    Last Vitals:  Vitals:   01/30/17 1220 01/30/17 1230  BP:  91/67  Pulse: (!) 45 64  Resp: (!) 8 (!) 7  Temp:    SpO2: 99% 97%    Last Pain:  Vitals:   01/30/17 0836  TempSrc: Oral                 [name]

## 2017-01-30 NOTE — Op Note (Signed)
Preoper resectoscope into the bladder.  The bladder was emptied.  [facility] and benign [facility] Postoperative diagnosis: Urinary retention and benign prostatic hyperplasia Surgery: Transurethral resection of the prostate and cystoscopy and insertion of Foley catheter Surgeon: Dr. Lorin PicketScott Rakesha Dalporto  The patient was prepped and draped in the usual fashion.  His urine was sterilized prior to surgery.  Preoperative antibiotics were given.  Extra care was taken with leg positioning to minimize the risk of compartment syndrome and neuropathy and deep vein thrombosis  Foley catheter was removed  A 26 French resectoscope was utilized and I entered the urethral meatus initially with the obturator and then I scoped the resectoscope into the bladder.  He had a short prostatic urethra.  He had bilobar enlargement of the prostate.  The verumontanum was very prominent and easily identified.  He had a minimal high riding bladder neck.  The ureteral orifices were far from the bladder neck and the trigone was normal.  He had a large capacity bladder with grade 2 trabeculation  I resected the bladder neck with my half loop technique circumferentially controlling all bleeders with coagulation.  Because of the shortness of the prostatic urethra was very careful in resecting the lobes not to go beyond the verumontanum.  The resection was done on the left side first.  Quite quickly I reached the capsule and cauterize bleeders.  I left a rim of apical tissue at 5:00 a few millimeters proximal to the proximal aspect of the verumontanum.  When I brought the scope back beyond or distal to the verumontanum I was concerned held opened the sphincter looked.  It did not look scarred but it was open and pictures were taken.  I carefully examined and took multiple pictures at this stage and at the end of the case noting that my resection was actually proximal to the proximal aspect of the  verumontanum and did not jeopardize whatsoever the sphincter.  Again the length of the prostatic urethral resection was quite short.  The same tissue was resected on the right down to the capsule.  I actually took a little bit more at the bladder neck at 5 and 7:00 hoping to unobstruct him as much as possible.  All fragments were irrigated.  At the bladder was clear.  There is no bladder injury.  After the coagulation there was virtually almost no bleeding.  My irrigation catheter 24 [place]French was easily inserted.  Again inspection of tissues at the level, just proximal to and beyond the verumontanum were not resected or injured.  He does not have a risk factor for a nonfunctioning or poorly functioning distal sphincter that was identified prior to surgery  When I would turn the flow down his sphincter did look more coapting in the sphincter did look to be even more distally located.  Leg position was good.  Fragments were sent to pathology.  Hopefully the operation will reach his treatment goal.  Intraoperative findings will be described to family and patient.  I am a little bit concerned of the amount of tissue that was obstructing but still hopeful that it will help free him from a catheter.  I was pleased that the resection was taken to the capsule and a lot of adenoma was not left behind

## 2017-01-30 NOTE — Anesthesia Procedure Notes (Signed)
Procedure Name: LMA Insertion Date/Time: 01/30/2017 10:45 AM Performed by: Maris BergerENENNY, Dequavion Follette T Pre-anesthesia Checklist: Patient identified, Emergency Drugs available, Suction available and Patient being monitored Patient Re-evaluated:Patient Re-evaluated prior to induction Oxygen Delivery Method: Circle system utilized Preoxygenation: Pre-oxygenation with 100% oxygen Induction Type: IV induction Ventilation: Mask ventilation without difficulty LMA: LMA inserted LMA Size: 5.0 Number of attempts: 1 Airway Equipment and Method: Bite block Placement Confirmation: positive ETCO2 Tube secured with: Tape Dental Injury: Teeth and Oropharynx as per pre-operative assessment

## 2017-01-31 NOTE — Progress Notes (Signed)
Pt c/o bladder feeling "full". CBI continues w/ clear very pale pink urine, no clots visible. Abdomen feels soft and not firm, does not appear distended. Manually irrigated catheter but no clots returned. Bladder scan performed and only revealed 64 ml urine. 2nd nurse assessment performed w/ RN from inpt urology floor. Emotional support and education provided to pt. At conclusion of this interaction, catheter continues to drain very pale pink clear urine. Continue to monitor closely.  Marvia PicklesJames, Perkins Molina Sara, RN

## 2017-01-31 NOTE — Progress Notes (Signed)
1 Day Post-Op Subjective: No acute events overnight. Pain controlled. Denies catheter discomfort.  Objective: Vital signs in last 24 hours: Temp:  [97.5 F (36.4 C)-98.5 F (36.9 C)] 97.5 F (36.4 C) (10/27 0618) Pulse Rate:  [45-88] 65 (10/27 0618) Resp:  [7-18] 18 (10/27 0618) BP: (85-110)/(60-75) 102/68 (10/27 0618) SpO2:  [93 %-100 %] 95 % (10/27 0618) Weight:  [120.7 kg (266 lb)] 120.7 kg (266 lb) (10/26 0836)  Intake/Output from previous day: 10/26 0701 - 10/27 0700 In: 6723.3 [P.O.:120; I.V.:1483.3] Out: [id number] [Urine:10125; Blood:50]  Intake/Output this shift: No intake/output data recorded.  Physical Exam:  General: Alert and oriented CV: RRR, palpable distal pulses Lungs: CTAB, equal chest rise Abdomen: Soft, NTND, no rebound or guarding UX:[id number]:three-way Foley catheter in place and draining clear-yellow urine. On minimal CBI. Ext: NT, No erythema  Lab Results:  Recent Labs  01/29/17 0946  HGB 16.6  HCT 49.0   BMET  Recent Labs  01/29/17 0946  NA 141  K 4.1  CL 105  CO2 26  GLUCOSE 105*  BUN 16  CREATININE 1.14  CALCIUM 8.9     Studies/Results: No results found.  Assessment/Plan: Postop day 1 status post TURP  -Out of bed to chair and ambulating -Advance diet -Home today with Foley catheter -f/u with Dr. Sherron MondayMacDiarmid for voiding trial in the office next week   LOS: 0 days   Rhoderick Moodyhristopher Catarina Huntley, MD 01/31/2017, 8:06 AM  [facility] Pager: [phone]

## 2017-02-02 ENCOUNTER — Encounter (HOSPITAL_BASED_OUTPATIENT_CLINIC_OR_DEPARTMENT_OTHER): Payer: Self-pay | Admitting: Urology

## 2017-02-03 NOTE — Discharge Summary (Signed)
Date of admission: 01/30/2017  Date of discharge: 01/31/2017  Admission diagnosis:  1.  BPH with lower urinary tract symptoms 2.  Recurrent UTI  Discharge diagnosis:  1.  BPH with lower urinary tract symptoms 2.  Recurrent UTI  Procedure: 1.  Bipolar TURP   History and Physical: For full details, please see admission history and physical. Briefly, Christopher Goodwin is a 71 y.o. year old patient with with BPH/LUTS and recurrent UTIs.    Hospital Course: The patient was monitored on the floor post-op without any acute events.  He was discharged home with his Foley catheter and plans to follow-up with Dr. Matilde Goodwin for a voiding trial. Laboratory values: No results for input(s): HGB, HCT in the last 72 hours. No results for input(s): CREATININE in the last 72 hours.  Disposition: Home  Discharge instruction: The patient was instructed to be ambulatory but told to refrain from heavy lifting, strenuous activity, or driving.   Discharge medications:  Allergies as of 01/31/2017   No Known Allergies     Medication List    TAKE these medications   atenolol 50 MG tablet Commonly known as:  TENORMIN Take 50 mg by mouth every evening.   cephALEXin 500 MG capsule Commonly known as:  KEFLEX Take 500 mg by mouth 3 (three) times daily. Per dr Rogue Bussing start 5 days prior to surgery 01-20-2017   Cyanocobalamin 1000 MCG/ML Kit Inject 1 mL as directed every 30 (thirty) days.   folic acid 1 MG tablet Commonly known as:  FOLVITE Take 1 mg by mouth daily.   Vitamin D 2000 units Caps Take 1 capsule by mouth daily.   warfarin 6 MG tablet Commonly known as:  COUMADIN Take 6 mg by mouth daily. Per pt given instructions from dr Rogue Bussing to stop 5 days prior to surgery 01-30-2017, last day taken sat.01-24-2017       Followup:  Follow-up Information    Christopher Loser, MD On 01/30/2017.   Specialty:  Urology Contact information: [name]  [phone]

## 2017-02-07 ENCOUNTER — Encounter (HOSPITAL_COMMUNITY): Payer: Self-pay | Admitting: Emergency Medicine

## 2017-02-07 ENCOUNTER — Emergency Department (HOSPITAL_COMMUNITY)
Admission: EM | Admit: 2017-02-07 | Discharge: 2017-02-08 | Disposition: A | Discharge status: Home / Self Care | Payer: Medicare Other | Attending: Emergency Medicine | Admitting: Emergency Medicine

## 2017-02-07 DIAGNOSIS — Z87891 Personal history of nicotine dependence: Secondary | ICD-10-CM | POA: Insufficient documentation

## 2017-02-07 DIAGNOSIS — Z7901 Long term (current) use of anticoagulants: Secondary | ICD-10-CM | POA: Insufficient documentation

## 2017-02-07 DIAGNOSIS — Z96653 Presence of artificial knee joint, bilateral: Secondary | ICD-10-CM | POA: Diagnosis not present

## 2017-02-07 DIAGNOSIS — R339 Retention of urine, unspecified: Secondary | ICD-10-CM | POA: Diagnosis not present

## 2017-02-07 DIAGNOSIS — Z79899 Other long term (current) drug therapy: Secondary | ICD-10-CM | POA: Insufficient documentation

## 2017-02-07 LAB — URINALYSIS, ROUTINE W REFLEX MICROSCOPIC
BILIRUBIN URINE: NEGATIVE
Bacteria, UA: NONE SEEN
GLUCOSE, UA: NEGATIVE mg/dL
KETONES UR: NEGATIVE mg/dL
LEUKOCYTES UA: NEGATIVE
NITRITE: NEGATIVE
PH: 6 (ref 5.0–8.0)
PROTEIN: NEGATIVE mg/dL
Specific Gravity, Urine: 1.009 (ref 1.005–1.030)
Squamous Epithelial / LPF: NONE SEEN

## 2017-02-07 MED ORDER — LIDOCAINE HCL 2 % EX GEL
1.0000 "application " | Freq: Once | CUTANEOUS | Status: AC | PRN
  Administered 2017-02-07: 1 via URETHRAL
  Filled 2017-02-07: qty 11

## 2017-02-07 NOTE — ED Provider Notes (Signed)
[facility] DEPT Provider Note   CSN: [id number] Arrival date & time: 02/07/17  2216     History   Chief Complaint Chief Complaint  Patient presents with  . Urinary Retention    HPI RODGERICK GILLIAND is a 71 y.o. male.  71 year old male with a history of cardiomyopathy, atrial fibrillation, on chronic Coumadin, TAA, and BPH s/p TURP on 01/30/17 by Dr. Lorra Hals presents to the ED for lower abdominal pressure. He states that he last voided at 1600. Notes foley removal post surgery on 02/03/17. No fever, dysuria prior to decreased UO, hematuria, vomiting. Patient finished a course of Ciprofloxacin 2 days ago.    The history is provided by the patient. No language interpreter was used.    Past Medical History:  Diagnosis Date  . Anticoagulated on Coumadin   . Arthritis   . BPH (benign prostatic hyperplasia)   . Cardiomyopathy, dilated, nonischemic ([place])    by 2008 cath and 2013 stress test results; as of 12/28/14: has been followed by Dr. Bernardo Heater in [place] (last seen 11/2014), moved to [facility] 12/2014  . Chronic atrial fibrillation [facility])    cardiologist-  dr Einar Gip---  per pt lov 10/ 2018  . Dilated aortic root (H[facility])    4.9 cm by 05/26/14 echo [facility], Dr. Donnal Debar)  . Foley catheter in place   . Thoracic ascending aortic aneurysm ([place])    per CT 08-10-2015  4.0cm  . Urinary retention   . Wears glasses     Patient Active Problem List   Diagnosis Date Noted  . Urinary retention 01/30/2017  . Spinal stenosis at L4-L5 level 01/03/2015    Past Surgical History:  Procedure Laterality Date  . CARDIAC CATHETERIZATION  04/10/2006  [place]    [facility]): Mild plaquing w/o obstructive CAD, LVEF 40-45%, normal right heart pressures.   Marland Kitchen CARDIOVASCULAR STRESS TEST  01-09-2017   DR Einar Gip   Low risk myocardial perfusion study w/ no ischemia/  LV function 45% (but visually appears normal)  .  CYSTOSCOPY N/A 01/30/2017   Procedure: CYSTOSCOPY;  Surgeon: Bjorn Loser, MD;  [facility]  Service: Urology;  Laterality: N/A;  . LUMBAR LAMINECTOMY/DECOMPRESSION MICRODISCECTOMY Left 01/03/2015   Procedure: Lumbar four, lumbar five left Laminectomy and Foraminotomy ;  Surgeon: Kary Kos, MD;  Location: [facility];  Service: Neurosurgery;  Laterality: Left;  . TOTAL KNEE ARTHROPLASTY Bilateral 2007;  2003  . TRANSTHORACIC ECHOCARDIOGRAM  09-04-2016   DR Einar Gip   moderate LVH, ef 45%, mild generalized hypokinesis/ due to Afib unable to evaluate diastolic function/  mild LAE/  trivial MR & TR/  mild PR /  moderate dilated aortic root 4.8cm , mild atherosclerotic aorta/  IVC dilated with respiratory variation  . TRANSURETHRAL RESECTION OF PROSTATE N/A 01/30/2017   Procedure: TRANSURETHRAL RESECTION OF THE PROSTATE (TURP);  Surgeon: Bjorn Loser, MD;  Location: [facility];  Service: Urology;  Laterality: N/A;       Home Medications    Prior to Admission medications   Medication Sig Start Date End Date Taking? Authorizing Provider  atenolol (TENORMIN) 50 MG tablet Take 50 mg by mouth every evening.     [provider]  cephALEXin (KEFLEX) 500 MG capsule Take 500 mg by mouth 3 (three) times daily. Per dr Rogue Bussing start 5 days prior to surgery 01-20-2017    [provider]  Cholecalciferol (VITAMIN D) 2000 UNITS CAPS Take 1 capsule by  mouth daily.    [provider]  Cyanocobalamin 1000 MCG/ML KIT Inject 1 mL as directed every 30 (thirty) days.     [provider]  folic acid (FOLVITE) 1 MG tablet Take 1 mg by mouth daily.    [provider]  warfarin (COUMADIN) 6 MG tablet Take 6 mg by mouth daily. Per pt given instructions from dr Rogue Bussing to stop 5 days prior to surgery 01-30-2017, last day taken sat.01-24-2017    [provider]    Family History History reviewed. No pertinent family  history.  Social History Social History  Substance Use Topics  . Smoking status: Former Smoker    Years: 15.00    Types: Cigars    Quit date: 01/24/2015  . Smokeless tobacco: Never Used  . Alcohol use 1.2 oz/week    2 Cans of beer per week     Comment: beer occ     Allergies   Patient has no known allergies.   Review of Systems Review of Systems Ten systems reviewed and are negative for acute change, except as noted in the HPI.    Physical Exam Updated Vital Signs BP 97/77   Pulse 74   Temp 97.6 F (36.4 C) (Oral)   Resp 18   Ht '6\' 2"'  (1.88 m)   Wt 120.2 kg (265 lb)   SpO2 97%   BMI 34.02 kg/m   Physical Exam  Constitutional: He is oriented to person, place, and time. He appears well-developed and well-nourished. No distress.  Nontoxic and in NAD  HENT:  Head: Normocephalic and atraumatic.  Eyes: Conjunctivae and EOM are normal. No scleral icterus.  Neck: Normal range of motion.  Pulmonary/Chest: Effort normal. No respiratory distress.  Respirations even and unlabored  Abdominal: Soft. He exhibits no distension and no mass. There is no tenderness. There is no guarding.  Soft, nontender abdomen.  Genitourinary:  Genitourinary Comments: Foley catheter in place with 800cc urine in foley bag. No hematuria or clots.  Musculoskeletal: Normal range of motion.  Neurological: He is alert and oriented to person, place, and time. He exhibits normal muscle tone. Coordination normal.  Skin: Skin is warm and dry. No rash noted. He is not diaphoretic. No erythema. No pallor.  Psychiatric: He has a normal mood and affect. His behavior is normal.  Nursing note and vitals reviewed.    ED Treatments / Results  Labs (all labs ordered are listed, but only abnormal results are displayed) Labs Reviewed  URINALYSIS, ROUTINE W REFLEX MICROSCOPIC - Abnormal; Notable for the following:       Result Value   Hgb urine dipstick MODERATE (*)    All other components within normal  limits  URINE CULTURE    EKG  EKG Interpretation None       Radiology No results found.  Procedures Procedures (including critical care time)  Medications Ordered in ED Medications  lidocaine (XYLOCAINE) 2 % jelly 1 application (1 application Urethral Given 02/07/17 2254)     Initial Impression / Assessment and Plan / ED Course  I have reviewed the triage vital signs and the nursing notes.  Pertinent labs & imaging results that were available during my care of the patient were reviewed by me and considered in my medical decision making (see chart for details).     71 year old male presents to the emergency department for urinary retention.  He is s/p TURP on 01/30/17 with foley removal on 02/03/17. No issues until today. Last void at 1600.  Patient with >840 on bladder scan. Foley placed without complications. No gross hematuria. No pyuria on UA.  Urine culture ordered. Patient discharged with instruction to follow-up with urology. Return precautions discussed and provided. Patient discharged in stable condition with no unaddressed concerns.   Final Clinical Impressions(s) / ED Diagnoses   Final diagnoses:  Urinary retention    New Prescriptions New Prescriptions   No medications on file     Antonietta Breach, Hershal Coria 02/08/17 0015    Sherwood Gambler, MD 02/08/17 [phone]

## 2017-02-07 NOTE — ED Triage Notes (Signed)
Pt reports not urinating since 4pm. Pt reports having catheter removed on 02/03/17. Currently bladder scan >81440ml

## 2017-02-07 NOTE — Discharge Instructions (Signed)
Your urine today did not show evidence of infection.  Keep your Foley in place until you are able to follow-up with your urologist.  Call on Monday to schedule a close follow-up appointment.  You may return to the emergency department, as needed, for new or concerning symptoms.

## 2017-02-09 LAB — URINE CULTURE: Culture: NO GROWTH

## 2017-02-22 ENCOUNTER — Emergency Department (HOSPITAL_COMMUNITY)
Admission: EM | Admit: 2017-02-22 | Discharge: 2017-02-22 | Disposition: A | Discharge status: Home / Self Care | Payer: Medicare Other | Attending: Emergency Medicine | Admitting: Emergency Medicine

## 2017-02-22 ENCOUNTER — Other Ambulatory Visit: Payer: Self-pay

## 2017-02-22 ENCOUNTER — Encounter (HOSPITAL_COMMUNITY): Payer: Self-pay | Admitting: Emergency Medicine

## 2017-02-22 DIAGNOSIS — Z96653 Presence of artificial knee joint, bilateral: Secondary | ICD-10-CM | POA: Insufficient documentation

## 2017-02-22 DIAGNOSIS — R339 Retention of urine, unspecified: Secondary | ICD-10-CM

## 2017-02-22 DIAGNOSIS — Z87891 Personal history of nicotine dependence: Secondary | ICD-10-CM | POA: Insufficient documentation

## 2017-02-22 DIAGNOSIS — Z7901 Long term (current) use of anticoagulants: Secondary | ICD-10-CM | POA: Insufficient documentation

## 2017-02-22 DIAGNOSIS — N3001 Acute cystitis with hematuria: Secondary | ICD-10-CM | POA: Insufficient documentation

## 2017-02-22 DIAGNOSIS — Z79899 Other long term (current) drug therapy: Secondary | ICD-10-CM | POA: Insufficient documentation

## 2017-02-22 DIAGNOSIS — R39198 Other difficulties with micturition: Secondary | ICD-10-CM | POA: Diagnosis present

## 2017-02-22 LAB — URINALYSIS, ROUTINE W REFLEX MICROSCOPIC
BILIRUBIN URINE: NEGATIVE
GLUCOSE, UA: NEGATIVE mg/dL
KETONES UR: NEGATIVE mg/dL
Nitrite: NEGATIVE
PROTEIN: NEGATIVE mg/dL
SQUAMOUS EPITHELIAL / LPF: NONE SEEN
Specific Gravity, Urine: 1.008 (ref 1.005–1.030)
pH: 6 (ref 5.0–8.0)

## 2017-02-22 LAB — I-STAT CHEM 8, ED
BUN: 20 mg/dL (ref 6–20)
CREATININE: 1.2 mg/dL (ref 0.61–1.24)
Calcium, Ion: 1.07 mmol/L — ABNORMAL LOW (ref 1.15–1.40)
Chloride: 107 mmol/L (ref 101–111)
GLUCOSE: 115 mg/dL — AB (ref 65–99)
HEMATOCRIT: 42 % (ref 39.0–52.0)
Hemoglobin: 14.3 g/dL (ref 13.0–17.0)
Potassium: 3.7 mmol/L (ref 3.5–5.1)
Sodium: 139 mmol/L (ref 135–145)
TCO2: 22 mmol/L (ref 22–32)

## 2017-02-22 LAB — PROTIME-INR
INR: 2.59
Prothrombin Time: 27.5 seconds — ABNORMAL HIGH (ref 11.4–15.2)

## 2017-02-22 MED ORDER — CEPHALEXIN 500 MG PO CAPS: 500.0000 mg | ORAL_CAPSULE | Freq: Three times a day (TID) | ORAL | 0 refills | Status: DC

## 2017-02-22 MED ORDER — CEFTRIAXONE SODIUM 1 G IJ SOLR: 1.0000 g | Freq: Once | INTRAMUSCULAR | Status: DC

## 2017-02-22 MED ORDER — CEFTRIAXONE SODIUM 1 G IJ SOLR
1.0000 g | Freq: Once | INTRAMUSCULAR | Status: AC
  Administered 2017-02-22: 1 g via INTRAVENOUS
  Filled 2017-02-22: qty 10

## 2017-02-22 MED ORDER — PHENAZOPYRIDINE HCL 100 MG PO TABS
100.0000 mg | ORAL_TABLET | Freq: Three times a day (TID) | ORAL | Status: DC
  Administered 2017-02-22: 100 mg via ORAL
  Filled 2017-02-22: qty 1

## 2017-02-22 MED ORDER — LIDOCAINE HCL 2 % EX GEL
1.0000 "application " | Freq: Once | CUTANEOUS | Status: AC | PRN
  Administered 2017-02-22: 1 via URETHRAL
  Filled 2017-02-22: qty 11

## 2017-02-22 NOTE — ED Notes (Signed)
Pt has been unable to void since 2230 last night. He just had a TURP procedure done 3 weeks ago and has had no issues emptying his bladder until last night. No pain just discomfort prior to catheter insertion.

## 2017-02-22 NOTE — ED Triage Notes (Signed)
Patient states he has not had any urine for about 6 hours. Patient is having pressure in lower abdomen and feels bloated. Patient has prostate surgery 3 weeks ago.

## 2017-02-22 NOTE — Discharge Instructions (Signed)
You were seen today for urinary retention.  A Foley catheter was placed.  You will need to follow-up with urology for removal and urinary testing.  It does appear that your urine is infected.  You will be started on an antibiotic.  If you note fevers or any increasing or worsening symptoms, you should be reevaluated.

## 2017-02-22 NOTE — ED Notes (Signed)
ED Provider at bedside. 

## 2017-02-22 NOTE — ED Provider Notes (Signed)
[place] DEPT Provider Note   CSN: [id number] Arrival date & time: 02/22/17  0213     History   Chief Complaint Chief Complaint  Patient presents with  . Urinary Retention    HPI Christopher Goodwin is a 71 y.o. male.  HPI  This is a 71 year old male with a history of atrial fibrillation on Coumadin, ischemic cardiomyopathy and recent issues with urinary retention who presents with urinary retention.  Patient reports difficulty urinating since yesterday evening.  He recently had a TURP.  He had a Foley catheter following the procedure that was removed.  On November 3, he presented to the ED and had a Foley catheter placed for urinary retention.  He states that he kept that Foley catheter for approximately 5 days.  This was removed by urology.  Since that time he has had no issue.  However, starting yesterday evening he had difficulty urinating.  Denies fevers or back pain.  He did note that his urine was becoming more cloudy.  Denies any abdominal pain, chest pain, shortness of breath.  Upon arrival, bladder scan with greater than 700 cc of urine.  Foley catheter was placed.  Patient reports some spasm with catheter placement but generally states that he feels better.  Past Medical History:  Diagnosis Date  . Anticoagulated on Coumadin   . Arthritis   . BPH (benign prostatic hyperplasia)   . Cardiomyopathy, dilated, nonischemic ([place])    by 2008 cath and 2013 stress test results; as of 12/28/14: has been followed by Dr. Bernardo Heater in [place] (last seen 11/2014), moved to [facility] 12/2014  . Chronic atrial fibrillation [facility])    cardiologist-  dr Einar Gip---  per pt lov 10/ 2018  . Dilated aortic root (HCC)    4.9 cm by 05/26/14 echo [facility], Dr. Donnal Debar)  . Foley catheter in place   . Thoracic ascending aortic aneurysm ([place])    per CT 08-10-2015  4.0cm  . Urinary retention   . Wears glasses     Patient Active Problem List    Diagnosis Date Noted  . Urinary retention 01/30/2017  . Spinal stenosis at L4-L5 level 01/03/2015    Past Surgical History:  Procedure Laterality Date  . CARDIAC CATHETERIZATION  04/10/2006  [place], [place]    [facility]): Mild plaquing w/o obstructive CAD, LVEF 40-45%, normal right heart pressures.   [facility] CARDIOVASCULAR STRESS TEST  01-09-2017   DR Einar Gip   Low risk myocardial perfusion study w/ no ischemia/  LV function 45% (but visually appears normal)  . CYSTOSCOPY N/A 01/30/2017   Performed by Bjorn Loser, MD at [facility]  . Lumbar four, lumbar five left Laminectomy and Foraminotomy Left 01/03/2015   Performed by Kary Kos, MD at [facility] NEURO ORS  . TOTAL KNEE ARTHROPLASTY Bilateral 2007;  2003  . TRANSTHORACIC ECHOCARDIOGRAM  09-04-2016   DR Einar Gip   moderate LVH, ef 45%, mild generalized hypokinesis/ due to Afib unable to evaluate diastolic function/  mild LAE/  trivial MR & TR/  mild PR /  moderate dilated aortic root 4.8cm , mild atherosclerotic aorta/  IVC dilated with respiratory variation  . TRANSURETHRAL RESECTION OF THE PROSTATE (TURP) N/A 01/30/2017   Performed by Bjorn Loser, MD at [facility] Medications    Prior to Admission medications   Medication Sig Start Date End Date Taking? Authorizing Provider  atenolol (TENORMIN) 50 MG tablet Take  50 mg by mouth every evening.    Yes [provider]  Cholecalciferol (VITAMIN D) 2000 UNITS CAPS Take 2,000 Units every evening by mouth.    Yes [provider]  Cyanocobalamin 1000 MCG/ML KIT Inject 1 mL as directed every 30 (thirty) days.    Yes [provider]  folic acid (FOLVITE) 1 MG tablet Take 1 mg every evening by mouth.    Yes [provider]  tamsulosin (FLOMAX) 0.4 MG CAPS capsule Take 0.4 mg every evening by mouth. 02/04/17  Yes [provider]  warfarin (COUMADIN) 6 MG tablet Take 6 mg every evening by mouth.     Yes [provider]  cephALEXin (KEFLEX) 500 MG capsule Take 1 capsule (500 mg total) 3 (three) times daily by mouth. 02/22/17   Terre Zabriskie, Barbette Hair, MD    Family History History reviewed. No pertinent family history.  Social History Social History   Tobacco Use  . Smoking status: Former Smoker    Years: 15.00    Types: Cigars    Last attempt to quit: 01/24/2015    Years since quitting: 2.0  . Smokeless tobacco: Never Used  Substance Use Topics  . Alcohol use: Yes    Alcohol/week: 1.2 oz    Types: 2 Cans of beer per week    Comment: beer occ  . Drug use: No     Allergies   Patient has no known allergies.   Review of Systems Review of Systems  Constitutional: Negative for fever.  Respiratory: Negative for shortness of breath.   Cardiovascular: Negative for chest pain.  Gastrointestinal: Negative for abdominal pain, nausea and vomiting.  Genitourinary: Positive for difficulty urinating. Negative for hematuria.  All other systems reviewed and are negative.    Physical Exam Updated Vital Signs BP 95/77 (BP Location: Right Arm)   Pulse 72   Temp (!) 97.4 F (36.3 C) (Oral)   Resp 14   Ht _0  (1.88 m)   Wt 120.2 kg (265 lb)   SpO2 94%   BMI 34.02 kg/m   Physical Exam  Constitutional: He is oriented to person, place, and time. He appears well-developed and well-nourished.  Elderly, nontoxic-appearing, no acute distress  HENT:  Head: Normocephalic and atraumatic.  Cardiovascular: Normal rate and normal heart sounds.  No murmur heard. Irregular rhythm  Pulmonary/Chest: Effort normal and breath sounds normal. No respiratory distress. He has no wheezes.  Abdominal: Soft. Bowel sounds are normal. There is no tenderness. There is no rebound.  Genitourinary:  Genitourinary Comments: Uncircumcised penis, Foley catheter in place, draining clots, urine is cloudy  Musculoskeletal: He exhibits no edema.  Neurological: He is alert and oriented to person,  place, and time.  Skin: Skin is warm and dry.  Psychiatric: He has a normal mood and affect.  Nursing note and vitals reviewed.    ED Treatments / Results  Labs (all labs ordered are listed, but only abnormal results are displayed) Labs Reviewed  URINALYSIS, ROUTINE W REFLEX MICROSCOPIC - Abnormal; Notable for the following components:      Result Value   APPearance CLOUDY (*)    Hgb urine dipstick MODERATE (*)    Leukocytes, UA LARGE (*)    Bacteria, UA MANY (*)    All other components within normal limits  PROTIME-INR - Abnormal; Notable for the following components:   Prothrombin Time 27.5 (*)    All other components within normal limits  I-STAT CHEM 8, ED - Abnormal; Notable for the following  components:   Glucose, Bld 115 (*)    Calcium, Ion 1.07 (*)    All other components within normal limits  URINE CULTURE    EKG  EKG Interpretation None       Radiology No results found.  Procedures Procedures (including critical care time)  Medications Ordered in ED Medications  phenazopyridine (PYRIDIUM) tablet 100 mg (100 mg Oral Given 02/22/17 0429)  lidocaine (XYLOCAINE) 2 % jelly 1 application (1 application Urethral Given 02/22/17 0246)  cefTRIAXone (ROCEPHIN) 1 g in dextrose 5 % 50 mL IVPB (0 g Intravenous Stopped 02/22/17 0500)     Initial Impression / Assessment and Plan / ED Course  I have reviewed the triage vital signs and the nursing notes.  Pertinent labs & imaging results that were available during my care of the patient were reviewed by me and considered in my medical decision making (see chart for details).     Patient presents with acute urinary retention.  Recent TURP procedure and Foley catheter placement with removal approximately 10 days ago.  He is nontoxic-appearing.  Afebrile.  Blood pressure is 95/77.  Patient states that this is his normal.  Upon Foley placement, he began to generate large clots.  Foley was replaced for a three-way Foley and  was irrigated.  It continues to drain without difficulty.  PT/INR with therapeutic INR.  Urinalysis does show evidence of infection with large leukocyte esterase, too numerous to count white cells with white cell clumps, and many bacteria.  This may be related to recent procedure and catheter placement.  Urine culture was sent.  Patient was given IV Rocephin.  He does not appear systemically ill appearing.  Will discharge on Keflex 3 times daily given patient is on Coumadin and not a good candidate for ciprofloxacin.  Follow-up with Dr. Marco Collie recommended.  After history, exam, and medical workup I feel the patient has been appropriately medically screened and is safe for discharge home. Pertinent diagnoses were discussed with the patient. Patient was given return precautions.     Final Clinical Impressions(s) / ED Diagnoses   Final diagnoses:  Urinary retention  Acute cystitis with hematuria    ED Discharge Orders        Ordered    cephALEXin (KEFLEX) 500 MG capsule  3 times daily     02/22/17 0513       Marlene Pfluger, Barbette Hair, MD 02/22/17 [phone]

## 2017-02-22 NOTE — ED Notes (Signed)
After inserting 3-way catheter of irrigation was done and produced a clear drainage into drainage bag. Pt reports improvement in discomfort at this time after changed catheter.

## 2017-02-22 NOTE — ED Notes (Signed)
On evaluation of drainage from foley catheter red drainage noted with clotting. Catheter was irrigated and pt still reporting discomfort. Clot measuring appx 7cm removed from drainage bag connection. Dr.Horton notified and order for 3-way catheter given.

## 2017-02-24 LAB — URINE CULTURE: SPECIAL REQUESTS: NORMAL

## 2017-02-25 ENCOUNTER — Telehealth: Payer: Self-pay | Admitting: *Deleted

## 2017-02-25 NOTE — Telephone Encounter (Signed)
Post ED Visit - Positive Culture Follow-up  Culture report reviewed by antimicrobial stewardship pharmacist:  []  [name], Pharm.D. []  [name], Pharm.D., BCPS AQ-ID []  [name], Pharm.D., BCPS []  [name], Pharm.D., BCPS []  [name], [address] Rainbow BoulevardPharm.D., BCPS, AAHIVP []  [name], Pharm.D., BCPS, AAHIVP []  [name], PharmD, BCPS []  [name], PharmD, BCPS []  [name], PharmD, BCPS  Positive urine culture Treated with cephalexin, organism sensitive to the same and no further patient follow-up is required at this time.  [name], [name] 02/25/2017, 9:54 AM

## 2017-02-26 ENCOUNTER — Encounter (HOSPITAL_COMMUNITY): Payer: Self-pay | Admitting: Nurse Practitioner

## 2017-02-26 ENCOUNTER — Emergency Department (HOSPITAL_COMMUNITY)
Admission: EM | Admit: 2017-02-26 | Discharge: 2017-02-26 | Disposition: A | Discharge status: Home / Self Care | Payer: Medicare Other | Attending: Emergency Medicine | Admitting: Emergency Medicine

## 2017-02-26 DIAGNOSIS — Z79899 Other long term (current) drug therapy: Secondary | ICD-10-CM | POA: Diagnosis not present

## 2017-02-26 DIAGNOSIS — R339 Retention of urine, unspecified: Secondary | ICD-10-CM | POA: Diagnosis present

## 2017-02-26 DIAGNOSIS — Z7901 Long term (current) use of anticoagulants: Secondary | ICD-10-CM | POA: Diagnosis not present

## 2017-02-26 DIAGNOSIS — N390 Urinary tract infection, site not specified: Secondary | ICD-10-CM | POA: Diagnosis not present

## 2017-02-26 DIAGNOSIS — T839XXA Unspecified complication of genitourinary prosthetic device, implant and graft, initial encounter: Secondary | ICD-10-CM

## 2017-02-26 DIAGNOSIS — Z87891 Personal history of nicotine dependence: Secondary | ICD-10-CM | POA: Insufficient documentation

## 2017-02-26 DIAGNOSIS — T83098A Other mechanical complication of other indwelling urethral catheter, initial encounter: Secondary | ICD-10-CM | POA: Diagnosis not present

## 2017-02-26 DIAGNOSIS — Y829 Unspecified medical devices associated with adverse incidents: Secondary | ICD-10-CM | POA: Insufficient documentation

## 2017-02-26 LAB — URINALYSIS, ROUTINE W REFLEX MICROSCOPIC
BILIRUBIN URINE: NEGATIVE
GLUCOSE, UA: NEGATIVE mg/dL
KETONES UR: NEGATIVE mg/dL
Nitrite: NEGATIVE
PH: 6 (ref 5.0–8.0)
PROTEIN: NEGATIVE mg/dL
Specific Gravity, Urine: 1.005 (ref 1.005–1.030)

## 2017-02-26 LAB — CBC WITH DIFFERENTIAL/PLATELET
BASOS PCT: 0 %
Basophils Absolute: 0 10*3/uL (ref 0.0–0.1)
EOS ABS: 0.6 10*3/uL (ref 0.0–0.7)
EOS PCT: 6 %
HEMATOCRIT: 39.7 % (ref 39.0–52.0)
Hemoglobin: 13.7 g/dL (ref 13.0–17.0)
Lymphocytes Relative: 17 %
Lymphs Abs: 1.6 10*3/uL (ref 0.7–4.0)
MCH: 30.9 pg (ref 26.0–34.0)
MCHC: 34.5 g/dL (ref 30.0–36.0)
MCV: 89.4 fL (ref 78.0–100.0)
MONO ABS: 0.5 10*3/uL (ref 0.1–1.0)
MONOS PCT: 6 %
Neutro Abs: 6.6 10*3/uL (ref 1.7–7.7)
Neutrophils Relative %: 71 %
PLATELETS: 202 10*3/uL (ref 150–400)
RBC: 4.44 MIL/uL (ref 4.22–5.81)
RDW: 12.7 % (ref 11.5–15.5)
WBC: 9.3 10*3/uL (ref 4.0–10.5)

## 2017-02-26 LAB — BASIC METABOLIC PANEL
ANION GAP: 6 (ref 5–15)
BUN: 16 mg/dL (ref 6–20)
CHLORIDE: 107 mmol/L (ref 101–111)
CO2: 25 mmol/L (ref 22–32)
Calcium: 8.5 mg/dL — ABNORMAL LOW (ref 8.9–10.3)
Creatinine, Ser: 1.08 mg/dL (ref 0.61–1.24)
GFR calc Af Amer: >60 mL/min (ref >60)
GLUCOSE: 103 mg/dL — AB (ref 65–99)
Potassium: 4 mmol/L (ref 3.5–5.1)
SODIUM: 138 mmol/L (ref 135–145)

## 2017-02-26 NOTE — ED Triage Notes (Signed)
Pt states he is experiencing urinary retention an ongoing problem since his prostate procedure. Also noticed blood earlier that has started clearing up.

## 2017-02-26 NOTE — ED Provider Notes (Signed)
[facility]-EMERGENCY DEPT Provider Note   CSN: [id number] Arrival date & time: 02/26/17  1731     History   Chief Complaint Chief Complaint  Patient presents with  . Urinary Retention    HPI Zachrey W Worrall is a 71 y.o. male with a past medical history of BPH, prior TURP, prior urinary retention, who presents to ED for evaluation of 5-hour history of urinary retention.  He states that he drank approximately thousand milliliters of water earlier today but is not having any urination.  He states that this is been an issue for him on and off since his TURP procedure about a month ago.  He has had this Foley catheter in since his previous visit 5 days ago.  He also reports abdominal distention and pain due to the retention.  He denies any other complaints including fever, bowel changes, vomiting, changes in medication.  He last saw his urologist 3 days ago.  HPI  Past Medical History:  Diagnosis Date  . Anticoagulated on Coumadin   . Arthritis   . BPH (benign prostatic hyperplasia)   . Cardiomyopathy, dilated, nonischemic (HCC)    by 2008 cath and 2013 stress test results; as of 12/28/14: has been followed by Dr. Joseph Hakas in [place] (last seen 11/2014), moved to [place] 12/2014  . Chronic atrial fibrillation (HCC)    cardiologist-  dr ganji---  per pt lov 10/ 2018  . Dilated aortic root (HCC)    4.9 cm by 05/26/14 echo ([facility], Dr. Hakas)  . Foley catheter in place   . Thoracic ascending aortic aneurysm (HCC)    per CT 08-10-2015  4.0cm  . Urinary retention   . Wears glasses     Patient Active Problem List   Diagnosis Date Noted  . Urinary retention 01/30/2017  . Spinal stenosis at L4-L5 level 01/03/2015    Past Surgical History:  Procedure Laterality Date  . CARDIAC CATHETERIZATION  04/10/2006  [place], [place]    ([facility]): Mild plaquing w/o obstructive CAD, LVEF 40-45%, normal right heart pressures.     . CARDIOVASCULAR STRESS TEST  01-09-2017   DR GANJI   Low risk myocardial perfusion study w/ no ischemia/  LV function 45% (but visually appears normal)  . CYSTOSCOPY N/A 01/30/2017   Procedure: CYSTOSCOPY;  Surgeon: MacDiarmid, Scott, MD;  Location: [facility];  Service: Urology;  Laterality: N/A;  . LUMBAR LAMINECTOMY/DECOMPRESSION MICRODISCECTOMY Left 01/03/2015   Procedure: Lumbar four, lumbar five left Laminectomy and Foraminotomy ;  Surgeon: Gary Cram, MD;  Location: [facility] NEURO ORS;  Service: Neurosurgery;  Laterality: Left;  . TOTAL KNEE ARTHROPLASTY Bilateral 2007;  2003  . TRANSTHORACIC ECHOCARDIOGRAM  09-04-2016   DR GANJI   moderate LVH, ef 45%, mild generalized hypokinesis/ due to Afib unable to evaluate diastolic function/  mild LAE/  trivial MR & TR/  mild PR /  moderate dilated aortic root 4.8cm , mild atherosclerotic aorta/  IVC dilated with respiratory variation  . TRANSURETHRAL RESECTION OF PROSTATE N/A 01/30/2017   Procedure: TRANSURETHRAL RESECTION OF THE PROSTATE (TURP);  Surgeon: MacDiarmid, Scott, MD;  Location: [facility];  Service: Urology;  Laterality: N/A;       Home Medications    Prior to Admission medications   Medication Sig Start Date End Date Taking? Authorizing Provider  atenolol (TENORMIN) 50 MG tablet Take 50 mg by mouth every evening.     [provider]  cephALEXin (KEFLEX) 500 MG   capsule Take 1 capsule (500 mg total) 3 (three) times daily by mouth. 02/22/17   Horton, Courtney F, MD  Cholecalciferol (VITAMIN D) 2000 UNITS CAPS Take 2,000 Units every evening by mouth.     [provider]  Cyanocobalamin 1000 MCG/ML KIT Inject 1 mL as directed every 30 (thirty) days.     [provider]  folic acid (FOLVITE) 1 MG tablet Take 1 mg every evening by mouth.     [provider]  tamsulosin (FLOMAX) 0.4 MG CAPS capsule Take 0.4 mg every evening by mouth. 02/04/17   [provider]   warfarin (COUMADIN) 6 MG tablet Take 6 mg every evening by mouth.     [provider]    Family History History reviewed. No pertinent family history.  Social History Social History   Tobacco Use  . Smoking status: Former Smoker    Years: 15.00    Types: Cigars    Last attempt to quit: 01/24/2015    Years since quitting: 2.0  . Smokeless tobacco: Never Used  Substance Use Topics  . Alcohol use: Yes    Alcohol/week: 1.2 oz    Types: 2 Cans of beer per week    Comment: beer occ  . Drug use: No     Allergies   Patient has no known allergies.   Review of Systems Review of Systems  Constitutional: Negative for appetite change, chills and fever.  HENT: Negative for ear pain, rhinorrhea, sneezing and sore throat.   Eyes: Negative for photophobia and visual disturbance.  Respiratory: Negative for cough, chest tightness, shortness of breath and wheezing.   Cardiovascular: Negative for chest pain and palpitations.  Gastrointestinal: Positive for abdominal distention and abdominal pain. Negative for blood in stool, constipation, diarrhea, nausea and vomiting.  Genitourinary: Positive for decreased urine volume, difficulty urinating and hematuria. Negative for dysuria and urgency.  Musculoskeletal: Negative for myalgias.  Skin: Negative for rash.  Neurological: Negative for dizziness, weakness and light-headedness.     Physical Exam Updated Vital Signs BP 110/77 (BP Location: Left Arm)   Pulse 93   Temp 97.6 F (36.4 C) (Oral)   Resp 16   SpO2 100%   Physical Exam  Constitutional: He appears well-developed and well-nourished. No distress.  HENT:  Head: Normocephalic and atraumatic.  Nose: Nose normal.  Eyes: Conjunctivae and EOM are normal. Left eye exhibits no discharge. No scleral icterus.  Neck: Normal range of motion. Neck supple.  Cardiovascular: Normal rate, regular rhythm, normal heart sounds and intact distal pulses. Exam reveals no gallop and no  friction rub.  No murmur heard. Pulmonary/Chest: Effort normal and breath sounds normal. No respiratory distress.  Abdominal: Soft. Bowel sounds are normal. He exhibits distension. There is tenderness (Suprapubic). There is no guarding.  Musculoskeletal: Normal range of motion. He exhibits no edema.  Neurological: He is alert. He exhibits normal muscle tone. Coordination normal.  Skin: Skin is warm and dry. No rash noted.  Psychiatric: He has a normal mood and affect.  Nursing note and vitals reviewed.    ED Treatments / Results  Labs (all labs ordered are listed, but only abnormal results are displayed) Labs Reviewed  BASIC METABOLIC PANEL - Abnormal; Notable for the following components:      Result Value   Glucose, Bld 103 (*)    Calcium 8.5 (*)    All other components within normal limits  URINALYSIS, ROUTINE W REFLEX MICROSCOPIC - Abnormal; Notable for the following components:   APPearance   HAZY (*)    Hgb urine dipstick LARGE (*)    Leukocytes, UA LARGE (*)    Bacteria, UA MANY (*)    Squamous Epithelial / LPF 0-5 (*)    All other components within normal limits  URINE CULTURE  CBC WITH DIFFERENTIAL/PLATELET    EKG  EKG Interpretation None       Radiology No results found.  Procedures Procedures (including critical care time)  Medications Ordered in ED Medications - No data to display   Initial Impression / Assessment and Plan / ED Course  I have reviewed the triage vital signs and the nursing notes.  Pertinent labs & imaging results that were available during my care of the patient were reviewed by me and considered in my medical decision making (see chart for details).     Patient, who is s/p TURP over a month ago presents to ED for urinary retention.  He states that he drank about a liter of water prior to arrival and has not urinated for the past several hours prior to arrival.  On physical exam patient does appear extremely uncomfortable with a  distended abdomen.  Bladder scan was done which showed greater than 200 cc of urine.  However, after Foley was flushed and he had close to 900 cc of urine that was released.  It appears to be a blood clot in the area.  UA was done which showed findings similar to previous.  Culture done 4 days ago showed susceptibility cephalosporins, and patient states that he is on Keflex.  CBC and BMP are unremarkable with no signs of kidney injury.  He does have an appointment scheduled with his urologist next week.  Encourage patient to continue his home medications as previously prescribed.  Patient appears stable for discharge at this time.  Strict return precautions given.  Final Clinical Impressions(s) / ED Diagnoses   Final diagnoses:  Urinary retention  Lower urinary tract infectious disease  Problem with Foley catheter, initial encounter [facility])    ED Discharge Orders    None       Delia Heady, PA-C 02/26/17 2048    Gareth Morgan, MD 02/27/17 0040

## 2017-02-26 NOTE — Discharge Instructions (Signed)
Continue your home medications as previously prescribed. Follow-up with your urologist for further evaluation. Return to ED for worsening symptoms, abdominal pain or distention, fever.

## 2017-02-26 NOTE — ED Notes (Signed)
Patient's leg bag emptied

## 2017-02-27 ENCOUNTER — Encounter (HOSPITAL_COMMUNITY): Payer: Self-pay

## 2017-02-27 ENCOUNTER — Emergency Department (HOSPITAL_COMMUNITY)
Admission: EM | Admit: 2017-02-27 | Discharge: 2017-02-27 | Disposition: A | Discharge status: Home / Self Care | Payer: Medicare Other | Attending: Emergency Medicine | Admitting: Emergency Medicine

## 2017-02-27 DIAGNOSIS — Y828 Other medical devices associated with adverse incidents: Secondary | ICD-10-CM | POA: Insufficient documentation

## 2017-02-27 DIAGNOSIS — T83098A Other mechanical complication of other indwelling urethral catheter, initial encounter: Secondary | ICD-10-CM | POA: Diagnosis not present

## 2017-02-27 DIAGNOSIS — Z87891 Personal history of nicotine dependence: Secondary | ICD-10-CM | POA: Diagnosis not present

## 2017-02-27 DIAGNOSIS — Z79899 Other long term (current) drug therapy: Secondary | ICD-10-CM | POA: Diagnosis not present

## 2017-02-27 DIAGNOSIS — Z7901 Long term (current) use of anticoagulants: Secondary | ICD-10-CM | POA: Insufficient documentation

## 2017-02-27 DIAGNOSIS — R339 Retention of urine, unspecified: Secondary | ICD-10-CM | POA: Insufficient documentation

## 2017-02-27 DIAGNOSIS — Z96653 Presence of artificial knee joint, bilateral: Secondary | ICD-10-CM | POA: Insufficient documentation

## 2017-02-27 NOTE — ED Triage Notes (Signed)
Pt states that he has been having urinary retention. Pt states that he was here last night for similar symptoms. Pt states that he had prostate surgery 4 weeks ago, and has been having blood clots obstructing his ability to urinate. Pt reports that he has abdominal bloating. Pt reports has urinary cath.  Unable to obtain oral temp. May require rectal temp. Pt denies pain but feels pressure in his abdomen.   Bladder scanner shows Appro. [id number] of urinary

## 2017-02-27 NOTE — ED Notes (Signed)
Pt took off BP cuff and is ready to go.  Did not sign discharge.

## 2017-02-27 NOTE — Discharge Instructions (Signed)
The blocked foley catheter was unclogged. Please continue with the expectant care with the Urologist.

## 2017-02-27 NOTE — ED Provider Notes (Signed)
[facility] DEPT Provider Note   CSN: [id number] Arrival date & time: 02/27/17  1237     History   Chief Complaint No chief complaint on file.   HPI Christopher Goodwin is a 71 y.o. male.  HPI  Patient with history of BPH, recent prostate surgery, nonischemic cardiomyopathy, chronic A. fib on Coumadin comes in with chief complaint of urinary retention. Patient reports that he started having urinary retention starting this morning.  Patient was seen in the ER recently with urinary retention.  He denies any bloody urine.  Patient denies any back pain.  Past Medical History:  Diagnosis Date  . Anticoagulated on Coumadin   . Arthritis   . BPH (benign prostatic hyperplasia)   . Cardiomyopathy, dilated, nonischemic ([place])    by 2008 cath and 2013 stress test results; as of 12/28/14: has been followed by Dr. Bernardo Heater in [place] (last seen 11/2014), moved to [facility] 12/2014  . Chronic atrial fibrillation [facility])    cardiologist-  dr Einar Gip---  per pt lov 10/ 2018  . Dilated aortic root (HCC)    4.9 cm by 05/26/14 echo [facility], Dr. Donnal Debar)  . Foley catheter in place   . Thoracic ascending aortic aneurysm ([place])    per CT 08-10-2015  4.0cm  . Urinary retention   . Wears glasses     Patient Active Problem List   Diagnosis Date Noted  . Urinary retention 01/30/2017  . Spinal stenosis at L4-L5 level 01/03/2015    Past Surgical History:  Procedure Laterality Date  . CARDIAC CATHETERIZATION  04/10/2006  [place], Bartholomew    [facility]): Mild plaquing w/o obstructive CAD, LVEF 40-45%, normal right heart pressures.   Marland Kitchen CARDIOVASCULAR STRESS TEST  01-09-2017   DR Einar Gip   Low risk myocardial perfusion study w/ no ischemia/  LV function 45% (but visually appears normal)  . CYSTOSCOPY N/A 01/30/2017   Procedure: CYSTOSCOPY;  Surgeon: Bjorn Loser, MD;  Location: [facility];  Service: Urology;   Laterality: N/A;  . LUMBAR LAMINECTOMY/DECOMPRESSION MICRODISCECTOMY Left 01/03/2015   Procedure: Lumbar four, lumbar five left Laminectomy and Foraminotomy ;  Surgeon: Kary Kos, MD;  Location: [facility];  Service: Neurosurgery;  Laterality: Left;  . TOTAL KNEE ARTHROPLASTY Bilateral 2007;  2003  . TRANSTHORACIC ECHOCARDIOGRAM  09-04-2016   DR Einar Gip   moderate LVH, ef 45%, mild generalized hypokinesis/ due to Afib unable to evaluate diastolic function/  mild LAE/  trivial MR & TR/  mild PR /  moderate dilated aortic root 4.8cm , mild atherosclerotic aorta/  IVC dilated with respiratory variation  . TRANSURETHRAL RESECTION OF PROSTATE N/A 01/30/2017   Procedure: TRANSURETHRAL RESECTION OF THE PROSTATE (TURP);  Surgeon: Bjorn Loser, MD;  Location: [facility];  Service: Urology;  Laterality: N/A;       Home Medications    Prior to Admission medications   Medication Sig Start Date End Date Taking? Authorizing Provider  atenolol (TENORMIN) 50 MG tablet Take 50 mg by mouth every evening.     [provider]  cephALEXin (KEFLEX) 500 MG capsule Take 1 capsule (500 mg total) 3 (three) times daily by mouth. 02/22/17   [place], Barbette Hair, MD  Cholecalciferol (VITAMIN D) 2000 UNITS CAPS Take 2,000 Units every evening by mouth.     [provider]  Cyanocobalamin 1000 MCG/ML KIT Inject 1 mL as directed every 30 (thirty) days.     [provider]  folic  acid (FOLVITE) 1 MG tablet Take 1 mg every evening by mouth.     [provider]  tamsulosin (FLOMAX) 0.4 MG CAPS capsule Take 0.4 mg every evening by mouth. 02/04/17   [provider]  warfarin (COUMADIN) 6 MG tablet Take 6 mg every evening by mouth.     [provider]    Family History History reviewed. No pertinent family history.  Social History Social History   Tobacco Use  . Smoking status: Former Smoker    Years: 15.00    Types: Cigars    Last attempt to quit:  01/24/2015    Years since quitting: 2.0  . Smokeless tobacco: Never Used  Substance Use Topics  . Alcohol use: Yes    Alcohol/week: 1.2 oz    Types: 2 Cans of beer per week    Comment: beer occ  . Drug use: No     Allergies   Patient has no known allergies.   Review of Systems Review of Systems  Constitutional: Positive for activity change.  Genitourinary: Positive for decreased urine volume.     Physical Exam Updated Vital Signs BP 97/81 (BP Location: Right Arm)   Pulse 84   Resp 16   SpO2 100%   Physical Exam  Constitutional: He is oriented to person, place, and time. He appears well-developed.  HENT:  Head: Atraumatic.  Neck: Neck supple.  Cardiovascular: Normal rate.  Pulmonary/Chest: Effort normal.  Abdominal: Soft. He exhibits distension.  Neurological: He is alert and oriented to person, place, and time.  Skin: Skin is warm.  Nursing note and vitals reviewed.    ED Treatments / Results  Labs (all labs ordered are listed, but only abnormal results are displayed) Labs Reviewed - No data to display  EKG  EKG Interpretation None       Radiology No results found.  Procedures Procedures (including critical care time)  Medications Ordered in ED Medications - No data to display   Initial Impression / Assessment and Plan / ED Course  I have reviewed the triage vital signs and the nursing notes.  Pertinent labs & imaging results that were available during my care of the patient were reviewed by me and considered in my medical decision making (see chart for details).     Patient comes in with chief complaint of urinary retention.  He has a Foley catheter in place, which appears to have clogged.  Our nursing team was able to dislodge the clot, and patient had free flow of the urine.   Final Clinical Impressions(s) / ED Diagnoses   Final diagnoses:  Blocked urinary catheter, initial encounter [facility])    ED Discharge Orders    None         Varney Biles, MD 02/27/17 1420

## 2017-02-28 LAB — URINE CULTURE: Culture: NO GROWTH

## 2017-08-11 ENCOUNTER — Other Ambulatory Visit: Payer: Self-pay | Admitting: Cardiology

## 2017-08-11 DIAGNOSIS — I7121 Aneurysm of the ascending aorta, without rupture: Secondary | ICD-10-CM

## 2017-08-11 DIAGNOSIS — I712 Thoracic aortic aneurysm, without rupture: Secondary | ICD-10-CM

## 2017-09-14 ENCOUNTER — Other Ambulatory Visit: Payer: Medicare Other

## 2017-09-15 ENCOUNTER — Ambulatory Visit
Admission: RE | Admit: 2017-09-15 | Discharge: 2017-09-15 | Disposition: A | Discharge status: Home / Self Care | Payer: Medicare Other | Source: Ambulatory Visit | Attending: Cardiology | Admitting: Cardiology

## 2017-09-15 DIAGNOSIS — I712 Thoracic aortic aneurysm, without rupture: Secondary | ICD-10-CM

## 2017-09-15 DIAGNOSIS — I7121 Aneurysm of the ascending aorta, without rupture: Secondary | ICD-10-CM

## 2017-09-15 MED ORDER — IOPAMIDOL (ISOVUE-370) INJECTION 76%
75.0000 mL | Freq: Once | INTRAVENOUS | Status: AC | PRN
  Administered 2017-09-15: 75 mL via INTRAVENOUS

## 2018-07-22 ENCOUNTER — Other Ambulatory Visit: Payer: Self-pay | Admitting: Cardiology

## 2018-07-22 DIAGNOSIS — I712 Thoracic aortic aneurysm, without rupture, unspecified: Secondary | ICD-10-CM

## 2018-08-31 NOTE — Progress Notes (Signed)
Primary Physician/Referring:  Javier [facility]ier, MD  Patient ID: Christopher Goodwin, male    DOB: Oct 18, 1945, 73 y.o.   MRN: [id number]  Chief Complaint  Patient presents with  . Atrial Fibrillation  . Follow-up    HPI: Christopher Goodwin  is a 73 y.o. male  with atrial fibrillation and aortic root dilatation/aneurysm.  He moved here from [place], [place]. Has moderate thoracic aortic aneurysm at 4.7 cm that is stable by CT scan in 2019. Has been stable since 2016 and correlates well with echocardiogram. No AAA by CT in 2016 and 2017. As there has been good correlation with echocardiogram, he is getting annual echoes and every other year CT scans.  Echocardiogram for the share is pending and was postponed due to Covid 19.  He started to notice chest tightness while he was walking up the hill about 3-4 weeks ago.  Symptoms resolved with resting. Since she was having exertional chest tightness since then, he decided to start down and has not walked over the past 4-5 days due to the fear of chest tightness.  He is also felt discomfort in his left arm under his armpit.  No rest pain.  Denies any associated dyspnea, palpitations, dizziness or syncope.  No chest pain with radiation to the back. No abdominal discomfort.  He has chronic low BP always.   Past Medical History:  Diagnosis Date  . Anticoagulated on Coumadin   . Arthritis   . BPH (benign prostatic hyperplasia)   . Cardiomyopathy, dilated, nonischemic ([place])    by 2008 cath and 2013 stress test results; as of 12/28/14: has been followed by Dr. Bernardo Heater in [place] (last seen 11/2014), moved to [facility] 12/2014  . Chronic atrial fibrillation    cardiologist-  dr Einar Gip---  per pt lov 10/ 2018  . Dilated aortic root (HCC)    4.9 cm by 05/26/14 echo [facility], Dr. Donnal Debar)  . Foley catheter in place   . Permanent atrial fibrillation 09/01/2018  . Thoracic ascending aortic aneurysm ([place])    per CT 08-10-2015  4.0cm  .  Urinary retention   . Wears glasses     Past Surgical History:  Procedure Laterality Date  . CARDIAC CATHETERIZATION  04/10/2006  [place], Roy    [facility]): Mild plaquing w/o obstructive CAD, LVEF 40-45%, normal right heart pressures.   Marland Kitchen CARDIOVASCULAR STRESS TEST  01-09-2017   DR Einar Gip   Low risk myocardial perfusion study w/ no ischemia/  LV function 45% (but visually appears normal)  . CYSTOSCOPY N/A 01/30/2017   Procedure: CYSTOSCOPY;  Surgeon: Bjorn Loser, MD;  Location: [facility];  Service: Urology;  Laterality: N/A;  . LUMBAR LAMINECTOMY/DECOMPRESSION MICRODISCECTOMY Left 01/03/2015   Procedure: Lumbar four, lumbar five left Laminectomy and Foraminotomy ;  Surgeon: Kary Kos, MD;  Location: [facility];  Service: Neurosurgery;  Laterality: Left;  . TOTAL KNEE ARTHROPLASTY Bilateral 2007;  2003  . TRANSTHORACIC ECHOCARDIOGRAM  09-04-2016   DR Einar Gip   moderate LVH, ef 45%, mild generalized hypokinesis/ due to Afib unable to evaluate diastolic function/  mild LAE/  trivial MR & TR/  mild PR /  moderate dilated aortic root 4.8cm , mild atherosclerotic aorta/  IVC dilated with respiratory variation  . TRANSURETHRAL RESECTION OF PROSTATE N/A 01/30/2017   Procedure: TRANSURETHRAL RESECTION OF THE PROSTATE (TURP);  Surgeon: Bjorn Loser, MD;  Location: [facility];  Service: Urology;  Laterality: N/A;    Social  History   Socioeconomic History  . Marital status: Married    Spouse name: Not on file  . Number of children: 0  . Years of education: Not on file  . Highest education level: Not on file  Occupational History  . Not on file  Social Needs  . Financial resource strain: Not on file  . Food insecurity:    Worry: Not on file    Inability: Not on file  . Transportation needs:    Medical: Not on file    Non-medical: Not on file  Tobacco Use  . Smoking status: Former Smoker    Years: 15.00    Types: Cigars    Last  attempt to quit: 01/24/2015    Years since quitting: 3.6  . Smokeless tobacco: Never Used  Substance and Sexual Activity  . Alcohol use: Yes    Alcohol/week: 2.0 standard drinks    Types: 2 Cans of beer per week    Comment: beer occ  . Drug use: No  . Sexual activity: Not on file  Lifestyle  . Physical activity:    Days per week: Not on file    Minutes per session: Not on file  . Stress: Not on file  Relationships  . Social connections:    Talks on phone: Not on file    Gets together: Not on file    Attends religious service: Not on file    Active member of club or organization: Not on file    Attends meetings of clubs or organizations: Not on file    Relationship status: Not on file  . Intimate partner violence:    Fear of current or ex partner: Not on file    Emotionally abused: Not on file    Physically abused: Not on file    Forced sexual activity: Not on file  Other Topics Concern  . Not on file  Social History Narrative  . Not on file    Current Outpatient Medications on File Prior to Visit  Medication Sig Dispense Refill  . atenolol (TENORMIN) 50 MG tablet Take 50 mg by mouth every evening.     . Cholecalciferol (VITAMIN D) 2000 UNITS CAPS Take 2,000 Units every evening by mouth.     . Cyanocobalamin 1000 MCG/ML KIT Inject 1 mL as directed every 30 (thirty) days.     . folic acid (FOLVITE) 1 MG tablet Take 1 mg every evening by mouth.     . tamsulosin (FLOMAX) 0.4 MG CAPS capsule Take 0.4 mg every evening by mouth.  10  . warfarin (COUMADIN) 6 MG tablet Take 6 mg every evening by mouth.      No current facility-administered medications on file prior to visit.     Review of Systems  Constitution: Negative for decreased appetite, malaise/fatigue, weight gain and weight loss.  Eyes: Negative for visual disturbance.  Cardiovascular: Positive for chest pain. Negative for claudication, dyspnea on exertion, leg swelling, orthopnea, palpitations and syncope.   Respiratory: Negative for hemoptysis and wheezing.   Endocrine: Negative for cold intolerance and heat intolerance.  Hematologic/Lymphatic: Does not bruise/bleed easily.  Skin: Negative for nail changes.  Musculoskeletal: Negative for muscle weakness and myalgias.  Gastrointestinal: Negative for abdominal pain, change in bowel habit, nausea and vomiting.  Neurological: Negative for difficulty with concentration, dizziness, focal weakness and headaches.  Psychiatric/Behavioral: Negative for altered mental status and suicidal ideas.  All other systems reviewed and are negative.     Objective  Blood pressure 101/73,  pulse 66, height '6\' 3"'  (1.905 m), weight 276 lb (125.2 kg), SpO2 97 %. Body mass index is 34.5 kg/m.    Physical Exam  Constitutional: He is oriented to person, place, and time. Vital signs are normal. He appears well-developed.  Mildly obese  HENT:  Head: Normocephalic and atraumatic.  Neck: Normal range of motion.  Cardiovascular: Normal rate, normal heart sounds and intact distal pulses. An irregular rhythm present.  Pulmonary/Chest: Effort normal and breath sounds normal. No accessory muscle usage. No respiratory distress.  Abdominal: Soft. Bowel sounds are normal.  Musculoskeletal: Normal range of motion.  Neurological: He is alert and oriented to person, place, and time.  Skin: Skin is warm and dry.  Vitals reviewed.  Radiology: No results found.  Laboratory examination:   Labs 06/23/2018: HB 16.7/HCT 48.8, platelets 170, normal indicis.  Potassium 4.4, serum glucose 73 mg, CMP normal, BUN 17, creatinine 1.12, eGFR greater than 60 ml.  TSH normal, B12 normal.  Total cholesterol 182, triglycerides 111, HDL 46, LDL 114.  Labs 06/10/2017: Total cholesterol 171, triglycerides 139, HDL 43, LDL 104. HB 17.2/HCT 51.0, platelets 205. Serum glucose 77 mg, BUN 16, creatinine 1.18, eGFR 61/71 mL, potassium 4.2. CMP normal.  CMP Latest Ref Rng & Units 02/26/2017 02/22/2017  01/29/2017  Glucose 65 - 99 mg/dL 103(H) 115(H) 105(H)  BUN 6 - 20 mg/dL '16 20 16  ' Creatinine 0.61 - 1.24 mg/dL 1.08 1.20 1.14  Sodium 135 - 145 mmol/L 138 139 141  Potassium 3.5 - 5.1 mmol/L 4.0 3.7 4.1  Chloride 101 - 111 mmol/L 107 107 105  CO2 22 - 32 mmol/L 25 - 26  Calcium 8.9 - 10.3 mg/dL 8.5(L) - 8.9   CBC Latest Ref Rng & Units 02/26/2017 02/22/2017 01/29/2017  WBC 4.0 - 10.5 K/uL 9.3 - 7.5  Hemoglobin 13.0 - 17.0 g/dL 13.7 14.3 16.6  Hematocrit 39.0 - 52.0 % 39.7 42.0 49.0  Platelets 150 - 400 K/uL 202 - 172   Lipid Panel  No results found for: CHOL, TRIG, HDL, CHOLHDL, VLDL, LDLCALC, LDLDIRECT HEMOGLOBIN A1C No results found for: HGBA1C, MPG TSH No results for input(s): TSH in the last 8760 hours.  Cardiac Studies:   Thoracic CTA 09/15/2017: Stable aneurysmal disease of the ascending thoracic aorta with maximal diameter of 4.7 cm at the level of the sinuses of Valsalva and 4.2 cm in the tubular ascending thoracic aorta.Ascending thoracic aortic aneurysm. Recommend semi-annual imaging followup by CTA or MRA. No significant change from 08/10/2015 and 06/06/2014. Normal caliber abdominal aorta. No significant atherosclerotic plaque. Hepatic simple cysts. Cholelithiasis with mild gallbladder distention but no biliary dilatation. Colonic diverticular disease. Lumbar degenerative disc disease.   Echocardiogram 03/10/2017: Left ventricle cavity is normal in size. Moderate concentric hypertrophy of the left ventricle. Mild decrease in global wall motion. Accurate assessment of systolic function limited due to atrial fibrilation Calculated EF 45%. Left atrial cavity is severely dilated. The aortic root is dilated. Proximal ascending aortic root measures 4.8 cm. Incidental finding of simple liver cyst. No significant change compared to prior study dated 09/04/2016.  Assessment   Permanent atrial fibrillation CHA2DS2-VASc Score is 2 with yearly risk of stroke of 2.3%. - Plan: EKG  12-Lead  Angina pectoris (HCC) - Plan: nitroGLYCERIN (NITROSTAT) 0.4 MG SL tablet, PCV MYOCARDIAL PERFUSION WITH LEXISCAN  Thoracic aortic aneurysm without rupture (HCC)  Class 1 obesity due to excess calories with serious comorbidity and body mass index (BMI) of 34.0 to 34.9 in adult  Mild hyperlipidemia -  Plan: atorvastatin (LIPITOR) 10 MG tablet, Lipid Panel With LDL/HDL Ratio   EKG 09/01/18: Atrial fibrillation controlled ventricular response at the rate of 73 bpm, normal axis, nonspecific T abnormality. No significant change from  EKG 10/05/2017.  Recommendations:   Patient is here on a six-month office visit of permanent atrial fibrillation, anticoagulation being managed by his PCP.  He is new symptoms of angina pectoris, S/L NTG was prescribed and explained how to and when to use it and to notify us if there is change in frequency of use. Interaction with cialis-like agents (if applicable was discussed).  Schedule for a Lexiscan Sestamibi stress test to evaluate for myocardial ischemia. Patient unable to do treadmill stress testing due to COVID-19.   He also has mild hyperlipidemia, previously has been reluctant to taking statins, after long discussion, he is willing to start Lipitor at 5 mg daily.  I'll check his lipids in 4 weeks.   I will perform CT scan every other year, and alternate with every other year of echocardiogram as his aneurysm has remained stable and there has been good corelation with the size of the ascending aorta unless he develops any symptoms of chest pain then he may need more frequent evaluation. Echocardiogram has been ordered. Office visit in 4-6 weeks for follow-up. I reviewed his labs from PCP.  Adrian Prows, MD, [facility] 09/01/2018, 12:03 PM [facility] [place] Pager: [phone] Office: [phone] If no answer Cell [phone]

## 2018-09-01 ENCOUNTER — Ambulatory Visit (INDEPENDENT_AMBULATORY_CARE_PROVIDER_SITE_OTHER): Payer: Medicare Other | Admitting: Cardiology

## 2018-09-01 ENCOUNTER — Encounter: Payer: Self-pay | Admitting: Cardiology

## 2018-09-01 ENCOUNTER — Other Ambulatory Visit: Payer: Self-pay

## 2018-09-01 VITALS — BP 101/73 | HR 66 | Ht 75.0 in | Wt 276.0 lb

## 2018-09-01 DIAGNOSIS — I209 Angina pectoris, unspecified: Secondary | ICD-10-CM | POA: Diagnosis not present

## 2018-09-01 DIAGNOSIS — I4821 Permanent atrial fibrillation: Secondary | ICD-10-CM | POA: Diagnosis not present

## 2018-09-01 DIAGNOSIS — I712 Thoracic aortic aneurysm, without rupture, unspecified: Secondary | ICD-10-CM

## 2018-09-01 DIAGNOSIS — Z6834 Body mass index (BMI) 34.0-34.9, adult: Secondary | ICD-10-CM

## 2018-09-01 DIAGNOSIS — E785 Hyperlipidemia, unspecified: Secondary | ICD-10-CM

## 2018-09-01 DIAGNOSIS — E6609 Other obesity due to excess calories: Secondary | ICD-10-CM

## 2018-09-01 HISTORY — DX: Permanent atrial fibrillation: I48.21

## 2018-09-01 MED ORDER — NITROGLYCERIN 0.4 MG SL SUBL
0.4000 mg | SUBLINGUAL_TABLET | SUBLINGUAL | 3 refills | Status: DC | PRN
Start: 2018-09-01 — End: 2020-10-25

## 2018-09-01 MED ORDER — ATORVASTATIN CALCIUM 10 MG PO TABS: 5.0000 mg | ORAL_TABLET | Freq: Every day | ORAL | 2 refills | Status: DC

## 2018-09-02 NOTE — Addendum Note (Signed)
Addended by: Delrae Rend on: 09/02/2018 10:54 AM   Modules accepted: Orders

## 2018-09-17 ENCOUNTER — Ambulatory Visit (INDEPENDENT_AMBULATORY_CARE_PROVIDER_SITE_OTHER): Payer: Medicare Other

## 2018-09-17 ENCOUNTER — Other Ambulatory Visit: Payer: Self-pay

## 2018-09-17 DIAGNOSIS — I712 Thoracic aortic aneurysm, without rupture, unspecified: Secondary | ICD-10-CM

## 2018-10-04 ENCOUNTER — Ambulatory Visit (INDEPENDENT_AMBULATORY_CARE_PROVIDER_SITE_OTHER): Payer: Medicare Other

## 2018-10-04 ENCOUNTER — Other Ambulatory Visit: Payer: Self-pay

## 2018-10-04 ENCOUNTER — Other Ambulatory Visit: Payer: Medicare Other

## 2018-10-04 DIAGNOSIS — I209 Angina pectoris, unspecified: Secondary | ICD-10-CM | POA: Diagnosis not present

## 2018-10-06 ENCOUNTER — Other Ambulatory Visit: Payer: Medicare Other

## 2018-10-06 LAB — LIPID PANEL WITH LDL/HDL RATIO
Cholesterol, Total: 118 mg/dL (ref 100–199)
HDL: 44 mg/dL (ref >39)
LDL Calculated: 55 mg/dL (ref 0–99)
LDl/HDL Ratio: 1.3 ratio (ref 0.0–3.6)
Triglycerides: 97 mg/dL (ref 0–149)
VLDL Cholesterol Cal: 19 mg/dL (ref 5–40)

## 2018-10-11 ENCOUNTER — Encounter: Payer: Self-pay | Admitting: Cardiology

## 2018-10-11 ENCOUNTER — Ambulatory Visit (INDEPENDENT_AMBULATORY_CARE_PROVIDER_SITE_OTHER): Payer: Medicare Other | Admitting: Cardiology

## 2018-10-11 ENCOUNTER — Other Ambulatory Visit: Payer: Self-pay

## 2018-10-11 VITALS — BP 108/77 | HR 67 | Ht 74.0 in | Wt 280.0 lb

## 2018-10-11 DIAGNOSIS — I712 Thoracic aortic aneurysm, without rupture, unspecified: Secondary | ICD-10-CM

## 2018-10-11 DIAGNOSIS — I4821 Permanent atrial fibrillation: Secondary | ICD-10-CM | POA: Diagnosis not present

## 2018-10-11 DIAGNOSIS — E785 Hyperlipidemia, unspecified: Secondary | ICD-10-CM

## 2018-10-11 DIAGNOSIS — I209 Angina pectoris, unspecified: Secondary | ICD-10-CM

## 2018-10-11 MED ORDER — ATORVASTATIN CALCIUM 10 MG PO TABS: 5.0000 mg | ORAL_TABLET | Freq: Every day | ORAL | 1 refills | Status: DC

## 2018-10-11 NOTE — Progress Notes (Signed)
Primary Physician/Referring:  Javier Glazier, MD  Patient ID: Christopher Goodwin, male    DOB: 27-May-1945, 73 y.o.   MRN: [id number]  Chief Complaint  Patient presents with   Chest Pain    follow up     HPI: Christopher Goodwin  is a 73 y.o. male  with permanent atrial fibrillation, ascending aortic aneurysm, mild hyperlipidemia and angina pectoris. Underwent echo and nuclear stress and presets for f/u.  He moved here from [place], [place]. Has moderate thoracic aortic aneurysm at 4.7 cm that is stable by CT scan in 2019. Has been stable since 2016 and correlates well with echocardiogram. No AAA by CT in 2016 and 2017.   He started to notice chest tightness with radiation to his left arm and armpit while he was walking up the hill  2 months ago discomfort in his left arm under his armpit.  No rest pain. No new symptoms since last OV and he has not resumed full activity yet.   Denies any associated dyspnea, palpitations, dizziness or syncope.  He has chronic low BP always.   Past Medical History:  Diagnosis Date   Anticoagulated on Coumadin    Arthritis    BPH (benign prostatic hyperplasia)    Cardiomyopathy, dilated, nonischemic ([place])    by 2008 cath and 2013 stress test results; as of 12/28/14: has been followed by Dr. Bernardo Heater in [place] (last seen 11/2014), moved to [facility] 12/2014   Chronic atrial fibrillation    cardiologist-  dr Einar Gip---  per pt lov 10/ 2018   Dilated aortic root ([place])    4.9 cm by 05/26/14 echo [facility], Dr. Donnal Debar)   Foley catheter in place    Permanent atrial fibrillation 09/01/2018   Thoracic ascending aortic aneurysm ([place])    per CT 08-10-2015  4.0cm   Urinary retention    Wears glasses     Past Surgical History:  Procedure Laterality Date   CARDIAC CATHETERIZATION  04/10/2006  [place], [place]    [facility]): Mild plaquing w/o obstructive CAD, LVEF 40-45%, normal right heart pressures.     CARDIOVASCULAR STRESS TEST  01-09-2017   DR Einar Gip   Low risk myocardial perfusion study w/ no ischemia/  LV function 45% (but visually appears normal)   CYSTOSCOPY N/A 01/30/2017   Procedure: CYSTOSCOPY;  Surgeon: Bjorn Loser, MD;  Location: [facility];  Service: Urology;  Laterality: N/A;   LUMBAR LAMINECTOMY/DECOMPRESSION MICRODISCECTOMY Left 01/03/2015   Procedure: Lumbar four, lumbar five left Laminectomy and Foraminotomy ;  Surgeon: Kary Kos, MD;  Location: [place] [facility];  Service: Neurosurgery;  Laterality: Left;   TOTAL KNEE ARTHROPLASTY Bilateral 2007;  2003   TRANSTHORACIC ECHOCARDIOGRAM  09-04-2016   DR Einar Gip   moderate LVH, ef 45%, mild generalized hypokinesis/ due to Afib unable to evaluate diastolic function/  mild LAE/  trivial MR & TR/  mild PR /  moderate dilated aortic root 4.8cm , mild atherosclerotic aorta/  IVC dilated with respiratory variation   TRANSURETHRAL RESECTION OF PROSTATE N/A 01/30/2017   Procedure: TRANSURETHRAL RESECTION OF THE PROSTATE (TURP);  Surgeon: Bjorn Loser, MD;  Location: [facility];  Service: Urology;  Laterality: N/A;    Social History   Socioeconomic History   Marital status: Married    Spouse name: Not on file   Number of children: 0   Years of education: Not on file   Highest education level: Not on file  Occupational History  Not on file  Social Needs   Financial resource strain: Not on file   Food insecurity    Worry: Not on file    Inability: Not on file   Transportation needs    Medical: Not on file    Non-medical: Not on file  Tobacco Use   Smoking status: Former Smoker    Years: 15.00    Types: Cigars    Quit date: 01/24/2015    Years since quitting: 3.7   Smokeless tobacco: Never Used  Substance and Sexual Activity   Alcohol use: Yes    Alcohol/week: 2.0 standard drinks    Types: 2 Cans of beer per week    Comment: beer occ   Drug use: No   Sexual activity:  Not on file  Lifestyle   Physical activity    Days per week: Not on file    Minutes per session: Not on file   Stress: Not on file  Relationships   Social connections    Talks on phone: Not on file    Gets together: Not on file    Attends religious service: Not on file    Active member of club or organization: Not on file    Attends meetings of clubs or organizations: Not on file    Relationship status: Not on file   Intimate partner violence    Fear of current or ex partner: Not on file    Emotionally abused: Not on file    Physically abused: Not on file    Forced sexual activity: Not on file  Other Topics Concern   Not on file  Social History Narrative   Not on file    Current Outpatient Medications on File Prior to Visit  Medication Sig Dispense Refill   atenolol (TENORMIN) 50 MG tablet Take 50 mg by mouth every evening.      Cholecalciferol (VITAMIN D) 2000 UNITS CAPS Take 2,000 Units every evening by mouth.      Cyanocobalamin 1000 MCG/ML KIT Inject 1 mL as directed every 30 (thirty) days.      folic acid (FOLVITE) 1 MG tablet Take 1 mg every evening by mouth.      tamsulosin (FLOMAX) 0.4 MG CAPS capsule Take 0.4 mg every evening by mouth.  10   warfarin (COUMADIN) 6 MG tablet Take 6 mg every evening by mouth.      nitroGLYCERIN (NITROSTAT) 0.4 MG SL tablet Place 1 tablet (0.4 mg total) under the tongue every 5 (five) minutes as needed for up to 25 days for chest pain. 25 tablet 3   No current facility-administered medications on file prior to visit.    Review of Systems  Constitution: Negative for decreased appetite, malaise/fatigue, weight gain and weight loss.  Eyes: Negative for visual disturbance.  Cardiovascular: Positive for chest pain. Negative for claudication, dyspnea on exertion, leg swelling, orthopnea, palpitations and syncope.  Respiratory: Negative for hemoptysis and wheezing.   Endocrine: Negative for cold intolerance and heat intolerance.   Hematologic/Lymphatic: Does not bruise/bleed easily.  Skin: Negative for nail changes.  Musculoskeletal: Negative for muscle weakness and myalgias.  Gastrointestinal: Negative for abdominal pain, change in bowel habit, nausea and vomiting.  Neurological: Negative for difficulty with concentration, dizziness, focal weakness and headaches.  Psychiatric/Behavioral: Negative for altered mental status and suicidal ideas.  All other systems reviewed and are negative.     Objective  Blood pressure 108/77, pulse 67, height _0  (1.88 m), weight 280 lb (127 kg), SpO2 95 %.  Body mass index is 35.95 kg/m.    Physical Exam  Constitutional: He is oriented to person, place, and time. Vital signs are normal. He appears well-developed.  Mildly obese  HENT:  Head: Normocephalic and atraumatic.  Neck: Normal range of motion.  Cardiovascular: Normal rate, normal heart sounds and intact distal pulses. An irregular rhythm present.  Pulmonary/Chest: Effort normal and breath sounds normal. No accessory muscle usage. No respiratory distress.  Abdominal: Soft. Bowel sounds are normal.  Musculoskeletal: Normal range of motion.  Neurological: He is alert and oriented to person, place, and time.  Skin: Skin is warm and dry.  Vitals reviewed.  Radiology: No results found.  Laboratory examination:   Labs 06/23/2018: HB 16.7/HCT 48.8, platelets 170, normal indicis.  Potassium 4.4, serum glucose 73 mg, CMP normal, BUN 17, creatinine 1.12, eGFR greater than 60 ml.  TSH normal, B12 normal.  Total cholesterol 182, triglycerides 111, HDL 46, LDL 114.  Labs 06/10/2017: Total cholesterol 171, triglycerides 139, HDL 43, LDL 104. HB 17.2/HCT 51.0, platelets 205. Serum glucose 77 mg, BUN 16, creatinine 1.18, eGFR 61/71 mL, potassium 4.2. CMP normal.  CMP Latest Ref Rng & Units 02/26/2017 02/22/2017 01/29/2017  Glucose 65 - 99 mg/dL 103(H) 115(H) 105(H)  BUN 6 - 20 mg/dL _0 Creatinine 0.61 - 1.24 mg/dL 1.08  1.20 1.14  Sodium 135 - 145 mmol/L 138 139 141  Potassium 3.5 - 5.1 mmol/L 4.0 3.7 4.1  Chloride 101 - 111 mmol/L 107 107 105  CO2 22 - 32 mmol/L 25 - 26  Calcium 8.9 - 10.3 mg/dL 8.5(L) - 8.9   CBC Latest Ref Rng & Units 02/26/2017 02/22/2017 01/29/2017  WBC 4.0 - 10.5 K/uL 9.3 - 7.5  Hemoglobin 13.0 - 17.0 g/dL 13.7 14.3 16.6  Hematocrit 39.0 - 52.0 % 39.7 42.0 49.0  Platelets 150 - 400 K/uL 202 - 172   Lipid Panel     Component Value Date/Time   CHOL 118 10/05/2018 0855   TRIG 97 10/05/2018 0855   HDL 44 10/05/2018 0855   LDLCALC 55 10/05/2018 0855   HEMOGLOBIN A1C No results found for: HGBA1C, MPG TSH No results for input(s): TSH in the last 8760 hours.  Cardiac Studies:   Thoracic CTA 09/15/2017: Stable aneurysmal disease of the ascending thoracic aorta with maximal diameter of 4.7 cm at the level of the sinuses of Valsalva and 4.2 cm in the tubular ascending thoracic aorta.Ascending thoracic aortic aneurysm. Recommend semi-annual imaging followup by CTA or MRA. No significant change from 08/10/2015 and 06/06/2014. Normal caliber abdominal aorta. No significant atherosclerotic plaque. Hepatic simple cysts. Cholelithiasis with mild gallbladder distention but no biliary dilatation. Colonic diverticular disease. Lumbar degenerative disc disease.  Echocardiogram 09/17/2018: Mildly depressed LV systolic function with EF 45%. Left ventricle cavity is normal in size. Moderate concentric hypertrophy of the left ventricle. Normal global wall motion. Unable to evaluate diastolic function due to A. Fibrillation. Left atrial cavity is severely dilated. Mild (Grade I) mitral regurgitation. Dilated ascending aorta measuring 4.7 cm. Simple liver cyst measuring 5.5 cm. No significant change compared to previous study on 03/10/2017.   Lexiscan Myoview Stress Test 10/04/2018: Resting EKG demonstrates atrial fibrillation with rapid ventricular response, leftward axis, incomplete right bundle  branch block, PVC.  Stress EKG is nondiagnostic for ischemia as a pharmacologic stress test. There is a moderate-sized mild inferior wall ischemia extending from the base towards the apex in the distribution of right coronary artery.  Left ventricular systolic function calculated by QGS  was 41% with inferior hypokinesis. High risk study.  Assessment   1. Angina pectoris ([place])   2. Permanent atrial fibrillation CHA2DS2-VASc Score is 2 with yearly risk of stroke of 2.3%.   3. Thoracic aortic aneurysm without rupture ([place])   4. Mild hyperlipidemia    EKG 09/01/18: Atrial fibrillation controlled ventricular response at the rate of 73 bpm, normal axis, nonspecific T abnormality. No significant change from  EKG 10/05/2017.  Recommendations:   Patient is here on a six-week office visit of angina pectoris, permanent atrial fibrillation, and hyperlipidemia. On his last OV I started him on NTG and lipitor 5 mg as he is reluctant to taking statins. Lipids are now normal. Has high risk stress test. No change in Ao aneurysm size and correlates with echo and CTA findings of thoracic aneurysm. Will repeat CTA every other year.   Due to reduced LVEF,Stress test could be considered a high-risk however clinically he appears to be intermediate risk, we discussed medical therapy versus proceeding with angiography.  Indications for angiography and possible angina past was discussed in detail.  Patient would like to try medical therapy.  Lipids are improved significantly, LDL used to be 105, no 55.  Hence will continue medical therapy for now and if he has frequent angina or lifestyle limiting angina then we will consider angiography.  I would like to see him back in 3 months.  Adrian Prows, MD, [facility] 10/11/2018, 8:27 PM [facility]. [place] Pager: [phone] Office: [phone] If no answer Cell [phone]

## 2018-10-14 ENCOUNTER — Ambulatory Visit: Payer: Medicare Other | Admitting: Cardiology

## 2018-12-25 ENCOUNTER — Other Ambulatory Visit: Payer: Self-pay

## 2018-12-25 ENCOUNTER — Encounter (HOSPITAL_COMMUNITY): Payer: Self-pay

## 2018-12-25 ENCOUNTER — Emergency Department (HOSPITAL_COMMUNITY)
Admission: EM | Admit: 2018-12-25 | Discharge: 2018-12-25 | Disposition: A | Discharge status: Home / Self Care | Payer: Medicare Other | Attending: Emergency Medicine | Admitting: Emergency Medicine

## 2018-12-25 DIAGNOSIS — Z7901 Long term (current) use of anticoagulants: Secondary | ICD-10-CM | POA: Insufficient documentation

## 2018-12-25 DIAGNOSIS — N3001 Acute cystitis with hematuria: Secondary | ICD-10-CM | POA: Insufficient documentation

## 2018-12-25 DIAGNOSIS — R300 Dysuria: Secondary | ICD-10-CM | POA: Diagnosis present

## 2018-12-25 DIAGNOSIS — Z79899 Other long term (current) drug therapy: Secondary | ICD-10-CM | POA: Insufficient documentation

## 2018-12-25 DIAGNOSIS — Z87891 Personal history of nicotine dependence: Secondary | ICD-10-CM | POA: Insufficient documentation

## 2018-12-25 LAB — CBC WITH DIFFERENTIAL/PLATELET
Abs Immature Granulocytes: 0.04 10*3/uL (ref 0.00–0.07)
Basophils Absolute: 0 10*3/uL (ref 0.0–0.1)
Basophils Relative: 0 %
Eosinophils Absolute: 0.6 10*3/uL — ABNORMAL HIGH (ref 0.0–0.5)
Eosinophils Relative: 6 %
HCT: 48.3 % (ref 39.0–52.0)
Hemoglobin: 15.8 g/dL (ref 13.0–17.0)
Immature Granulocytes: 0 %
Lymphocytes Relative: 12 %
Lymphs Abs: 1.1 10*3/uL (ref 0.7–4.0)
MCH: 30 pg (ref 26.0–34.0)
MCHC: 32.7 g/dL (ref 30.0–36.0)
MCV: 91.8 fL (ref 80.0–100.0)
Monocytes Absolute: 0.8 10*3/uL (ref 0.1–1.0)
Monocytes Relative: 8 %
Neutro Abs: 6.9 10*3/uL (ref 1.7–7.7)
Neutrophils Relative %: 74 %
Platelets: 175 10*3/uL (ref 150–400)
RBC: 5.26 MIL/uL (ref 4.22–5.81)
RDW: 12.8 % (ref 11.5–15.5)
WBC: 9.5 10*3/uL (ref 4.0–10.5)
nRBC: 0 % (ref 0.0–0.2)

## 2018-12-25 LAB — COMPREHENSIVE METABOLIC PANEL
ALT: 26 U/L (ref 0–44)
AST: 22 U/L (ref 15–41)
Albumin: 3.8 g/dL (ref 3.5–5.0)
Alkaline Phosphatase: 62 U/L (ref 38–126)
Anion gap: 8 (ref 5–15)
BUN: 20 mg/dL (ref 8–23)
CO2: 25 mmol/L (ref 22–32)
Calcium: 8.6 mg/dL — ABNORMAL LOW (ref 8.9–10.3)
Chloride: 106 mmol/L (ref 98–111)
Creatinine, Ser: 1.35 mg/dL — ABNORMAL HIGH (ref 0.61–1.24)
GFR calc Af Amer: 60 mL/min — ABNORMAL LOW (ref >60)
GFR calc non Af Amer: 52 mL/min — ABNORMAL LOW (ref >60)
Glucose, Bld: 103 mg/dL — ABNORMAL HIGH (ref 70–99)
Potassium: 3.9 mmol/L (ref 3.5–5.1)
Sodium: 139 mmol/L (ref 135–145)
Total Bilirubin: 1.3 mg/dL — ABNORMAL HIGH (ref 0.3–1.2)
Total Protein: 6.9 g/dL (ref 6.5–8.1)

## 2018-12-25 LAB — URINALYSIS, ROUTINE W REFLEX MICROSCOPIC
Bilirubin Urine: NEGATIVE
Glucose, UA: NEGATIVE mg/dL
Ketones, ur: NEGATIVE mg/dL
Nitrite: POSITIVE — AB
Protein, ur: 100 mg/dL — AB
RBC / HPF: >50 RBC/hpf — ABNORMAL HIGH (ref 0–5)
Specific Gravity, Urine: 1.008 (ref 1.005–1.030)
WBC, UA: >50 WBC/hpf — ABNORMAL HIGH (ref 0–5)
pH: 6 (ref 5.0–8.0)

## 2018-12-25 LAB — LACTIC ACID, PLASMA: Lactic Acid, Venous: 1.2 mmol/L (ref 0.5–1.9)

## 2018-12-25 MED ORDER — CEPHALEXIN 500 MG PO CAPS: 500.0000 mg | ORAL_CAPSULE | Freq: Three times a day (TID) | ORAL | 0 refills | Status: DC

## 2018-12-25 MED ORDER — SODIUM CHLORIDE 0.9 % IV BOLUS
1000.0000 mL | Freq: Once | INTRAVENOUS | Status: AC
  Administered 2018-12-25: 13:00:00 1000 mL via INTRAVENOUS

## 2018-12-25 NOTE — ED Notes (Signed)
He tells me he has had "low blood pressure all my life. My mother had that too." he is in no distress.

## 2018-12-25 NOTE — ED Provider Notes (Signed)
[facility] DEPT Provider Note   CSN: [id number] Arrival date & time: 12/25/18  0910     History   Chief Complaint Chief Complaint  Patient presents with  . Urinary Tract Infection    HPI Christopher Goodwin is a 73 y.o. male.     HPI  73 year old male, with PMH of BPH, A. fib, presents with dysuria and suprapubic abdominal "pressure."  Patient states that he has been seen by urology for his dysuria over the last month.  He was given a cephalosporin and then Macrobid and states symptoms improved but did not completely resolve.  He was started on Bactrim 2 days ago but continues to have symptoms.  Urology recommended he come to the ED for further evaluation.  Patient denies any abdominal pain in the ED.  He denies any fevers, chills, nausea, vomiting, myalgias.  He denies any back or flank pain.  Past Medical History:  Diagnosis Date  . Anticoagulated on Coumadin   . Arthritis   . BPH (benign prostatic hyperplasia)   . Cardiomyopathy, dilated, nonischemic ([place])    by 2008 cath and 2013 stress test results; as of 12/28/14: has been followed by Dr. Bernardo Heater in [place] (last seen 11/2014), moved to [facility] 12/2014  . Chronic atrial fibrillation    cardiologist-  dr Einar Gip---  per pt lov 10/ 2018  . Dilated aortic root ([facility])    4.9 cm by 05/26/14 echo [facility], Dr. Donnal Debar)  . Foley catheter in place   . Permanent atrial fibrillation 09/01/2018  . Thoracic ascending aortic aneurysm ([place])    per CT 08-10-2015  4.0cm  . Urinary retention   . Wears glasses     Patient Active Problem List   Diagnosis Date Noted  . Permanent atrial fibrillation 09/01/2018  . Urinary retention 01/30/2017  . Spinal stenosis at L4-L5 level 01/03/2015    Past Surgical History:  Procedure Laterality Date  . CARDIAC CATHETERIZATION  04/10/2006  [place], [place]    [facility]): Mild plaquing w/o obstructive CAD, LVEF 40-45%, normal  right heart pressures.   [facility] CARDIOVASCULAR STRESS TEST  01-09-2017   DR Einar Gip   Low risk myocardial perfusion study w/ no ischemia/  LV function 45% (but visually appears normal)  . CYSTOSCOPY N/A 01/30/2017   Procedure: CYSTOSCOPY;  Surgeon: Bjorn Loser, MD;  Location: [facility];  Service: Urology;  Laterality: N/A;  . LUMBAR LAMINECTOMY/DECOMPRESSION MICRODISCECTOMY Left 01/03/2015   Procedure: Lumbar four, lumbar five left Laminectomy and Foraminotomy ;  Surgeon: Kary Kos, MD;  Location: [place] [facility];  Service: Neurosurgery;  Laterality: Left;  . TOTAL KNEE ARTHROPLASTY Bilateral 2007;  2003  . TRANSTHORACIC ECHOCARDIOGRAM  09-04-2016   DR Einar Gip   moderate LVH, ef 45%, mild generalized hypokinesis/ due to Afib unable to evaluate diastolic function/  mild LAE/  trivial MR & TR/  mild PR /  moderate dilated aortic root 4.8cm , mild atherosclerotic aorta/  IVC dilated with respiratory variation  . TRANSURETHRAL RESECTION OF PROSTATE N/A 01/30/2017   Procedure: TRANSURETHRAL RESECTION OF THE PROSTATE (TURP);  Surgeon: Bjorn Loser, MD;  Location: [facility];  Service: Urology;  Laterality: N/A;        Home Medications    Prior to Admission medications   Medication Sig Start Date End Date Taking? Authorizing Provider  atenolol (TENORMIN) 50 MG tablet Take 50 mg by mouth every evening.    Yes [provider]  atorvastatin (LIPITOR)  10 MG tablet Take 0.5 tablets (5 mg total) by mouth daily. As directed 10/11/18  Yes Adrian Prows, MD  Cholecalciferol (VITAMIN D) 2000 UNITS CAPS Take 2,000 Units every evening by mouth.    Yes [provider]  Cyanocobalamin 1000 MCG/ML KIT Inject 1 mL as directed every 30 (thirty) days.    Yes [provider]  folic acid (FOLVITE) 1 MG tablet Take 1 mg every evening by mouth.    Yes [provider]  tamsulosin (FLOMAX) 0.4 MG CAPS capsule Take 0.4 mg every evening by mouth. 02/04/17  Yes  [provider]  trimethoprim (TRIMPEX) 100 MG tablet Take 100 mg by mouth daily.   Yes [provider]  warfarin (COUMADIN) 6 MG tablet Take 6 mg every evening by mouth.    Yes [provider]  nitroGLYCERIN (NITROSTAT) 0.4 MG SL tablet Place 1 tablet (0.4 mg total) under the tongue every 5 (five) minutes as needed for up to 25 days for chest pain. 09/01/18 09/26/18  Adrian Prows, MD    Family History No family history on file.  Social History Social History   Tobacco Use  . Smoking status: Former Smoker    Years: 15.00    Types: Cigars    Quit date: 01/24/2015    Years since quitting: 3.9  . Smokeless tobacco: Never Used  Substance Use Topics  . Alcohol use: Yes    Alcohol/week: 2.0 standard drinks    Types: 2 Cans of beer per week    Comment: beer occ  . Drug use: No     Allergies   Patient has no known allergies.   Review of Systems Review of Systems  Constitutional: Negative for chills and fever.  Respiratory: Negative for shortness of breath.   Cardiovascular: Negative for chest pain.  Gastrointestinal: Positive for abdominal pain. Negative for nausea and vomiting.  Genitourinary: Positive for dysuria, frequency and hematuria.  All other systems reviewed and are negative.    Physical Exam Updated Vital Signs BP 111/80   Pulse (!) 103   Temp 97.7 F (36.5 C) (Oral)   Resp 18   Wt 127.5 kg   SpO2 97%   BMI 36.08 kg/m   Physical Exam Vitals signs and nursing note reviewed.  Constitutional:      Appearance: He is well-developed.  HENT:     Head: Normocephalic and atraumatic.  Eyes:     Conjunctiva/sclera: Conjunctivae normal.  Neck:     Musculoskeletal: Neck supple.  Cardiovascular:     Rate and Rhythm: Normal rate and regular rhythm.     Heart sounds: Normal heart sounds. No murmur.  Pulmonary:     Effort: Pulmonary effort is normal. No respiratory distress.     Breath sounds: Normal breath sounds. No wheezing or rales.   Abdominal:     General: Bowel sounds are normal. There is no distension.     Palpations: Abdomen is soft.     Tenderness: There is no abdominal tenderness. There is no right CVA tenderness or left CVA tenderness.  Musculoskeletal: Normal range of motion.        General: No tenderness or deformity.  Skin:    General: Skin is warm and dry.     Findings: No erythema or rash.  Neurological:     Mental Status: He is alert and oriented to person, place, and time.  Psychiatric:        Behavior: Behavior normal.      ED Treatments / Results  Labs (all labs ordered are listed, but only abnormal results are displayed) Labs Reviewed  URINALYSIS, ROUTINE W REFLEX MICROSCOPIC - Abnormal; Notable for the following components:      Result Value   APPearance TURBID (*)    Hgb urine dipstick MODERATE (*)    Protein, ur 100 (*)    Nitrite POSITIVE (*)    Leukocytes,Ua LARGE (*)    RBC / HPF >50 (*)    WBC, UA >50 (*)    Bacteria, UA FEW (*)    All other components within normal limits  COMPREHENSIVE METABOLIC PANEL - Abnormal; Notable for the following components:   Glucose, Bld 103 (*)    Creatinine, Ser 1.35 (*)    Calcium 8.6 (*)    Total Bilirubin 1.3 (*)    GFR calc non Af Amer 52 (*)    GFR calc Af Amer 60 (*)    All other components within normal limits  CBC WITH DIFFERENTIAL/PLATELET - Abnormal; Notable for the following components:   Eosinophils Absolute 0.6 (*)    All other components within normal limits  URINE CULTURE  CULTURE, BLOOD (ROUTINE X 2)  CULTURE, BLOOD (ROUTINE X 2)  LACTIC ACID, PLASMA    EKG None  Radiology No results found.  Procedures Procedures (including critical care time)  Medications Ordered in ED Medications  sodium chloride 0.9 % bolus 1,000 mL (1,000 mLs Intravenous New Bag/Given 12/25/18 1307)     Initial Impression / Assessment and Plan / ED Course  I have reviewed the triage vital signs and the nursing notes.  Pertinent labs &  imaging results that were available during my care of the patient were reviewed by me and considered in my medical decision making (see chart for details).        Patient presents with urinary symptoms.  His urine is concerning for a UTI.  He denies any systemic symptoms, fevers, chills, myalgias.  He states suprapubic abdominal pressure.  However on physical exam he has no tenderness over the suprapubic region.  He has had multiple re-evaluations which continue to show no abdominal pain.  Patient also has no CVA tenderness.  Patient states he is not had any pain in the ED.  Initially he had a soft blood pressure, afebrile, not tachycardic.  He was given some fluids, blood cultures and lactic were drawn.  Blood work is reassuring, lactic is normal.  Patient has no elevation of the white count.  His urine was sent for culture.  He was given a gram of ceftriaxone in the ED.  Reviewed old culture which shows it is sensitive to ceftriaxone and cefazolin.  Given that patient has no systemic symptoms, no symptoms of pyelonephritis, he is reasonable for outpatient management.  We will try Keflex for 10 days given complicated UTI in a male.  Of note discharge vitals show a heart rate of 103.  Patient was evaluated by the provider just prior to discharge and his heart rate was in the 80s consistently.  Patient resting comfortably in bed, no acute distress, nontoxic, non-lethargic.  He was agreeable with plan for outpatient antibiotics.   At this time there does not appear to be any evidence of an acute emergency medical condition and the patient appears stable for discharge with appropriate outpatient follow up.Diagnosis was discussed with patient who verbalizes understanding and is agreeable to discharge. Pt case discussed with Dr. Zenia Resides who agrees with my plan.   Final Clinical Impressions(s) / ED Diagnoses   Final  diagnoses:  None    ED Discharge Orders    None       Rachel Moulds  12/25/18 1733    Lacretia Leigh, MD 12/26/18 1106

## 2018-12-25 NOTE — Discharge Instructions (Addendum)
Please take all Keflex as prescribed.  Please follow-up with your urologist on Monday for continued evaluation.  Return to the ED immediately for new or worsening symptoms or concerns, fever, abdominal pain, back pain, vomiting or any concerns at all.

## 2018-12-25 NOTE — ED Triage Notes (Signed)
Pt states that he has had a bladder infection x 1 month. Pt states he has seen urology, and has tried different abx without relief. Urology told him to come in for eval.

## 2018-12-27 LAB — URINE CULTURE: Culture: >=100000 — AB

## 2018-12-28 ENCOUNTER — Telehealth: Payer: Self-pay | Admitting: *Deleted

## 2018-12-28 NOTE — Telephone Encounter (Signed)
Post ED Visit - Positive Culture Follow-up  Culture report reviewed by antimicrobial stewardship pharmacist: [place] Team []  Elenor Quinones, Pharm.D. []  Heide Guile, Pharm.D., BCPS AQ-ID []  Parks Neptune, Pharm.D., BCPS []  Alycia Rossetti, Pharm.D., BCPS []  [place], [place].D., BCPS, AAHIVP []  Legrand Como, Pharm.D., BCPS, AAHIVP []  Salome Arnt, PharmD, BCPS []  Johnnette Gourd, PharmD, BCPS []  Hughes Better, PharmD, BCPS []  Leeroy Cha, PharmD []  Laqueta Linden, PharmD, BCPS []  Albertina Parr, PharmD  [place] Team []  Leodis Sias, PharmD []  Lindell Spar, PharmD []  Royetta Asal, PharmD []  Graylin Shiver, Rph []  Rema Fendt) Glennon Mac, PharmD []  Arlyn Dunning, PharmD []  Netta Cedars, PharmD [x]  Dia Sitter, PharmD []  Leone Haven, PharmD []  Gretta Arab, PharmD []  Theodis Shove, PharmD []  Peggyann Juba, PharmD []  Reuel Boom, PharmD   Positive urine culture Treated with Cephalexin, organism sensitive to the same and no further patient follow-up is required at this time.  Harlon Flor [facility] 12/28/2018, 10:06 AM

## 2018-12-30 LAB — CULTURE, BLOOD (ROUTINE X 2)
Culture: NO GROWTH
Culture: NO GROWTH
Special Requests: ADEQUATE

## 2019-01-13 ENCOUNTER — Encounter: Payer: Self-pay | Admitting: Cardiology

## 2019-01-13 ENCOUNTER — Ambulatory Visit (INDEPENDENT_AMBULATORY_CARE_PROVIDER_SITE_OTHER): Payer: Medicare Other | Admitting: Cardiology

## 2019-01-13 ENCOUNTER — Other Ambulatory Visit: Payer: Self-pay

## 2019-01-13 VITALS — BP 102/50 | HR 40 | Temp 97.3°F | Ht 75.0 in | Wt 274.3 lb

## 2019-01-13 DIAGNOSIS — I209 Angina pectoris, unspecified: Secondary | ICD-10-CM

## 2019-01-13 DIAGNOSIS — I4821 Permanent atrial fibrillation: Secondary | ICD-10-CM

## 2019-01-13 DIAGNOSIS — I712 Thoracic aortic aneurysm, without rupture, unspecified: Secondary | ICD-10-CM

## 2019-01-13 NOTE — Progress Notes (Signed)
Primary Physician/Referring:  Javier Glazier, MD  Patient ID: Christopher Goodwin, male    DOB: 01-02-1946, 72 y.o.   MRN: [id number]  Chief Complaint  Patient presents with  . Chest Pain  . Atrial Fibrillation  . Follow-up    3 month    HPI: Christopher Goodwin  is a 73 y.o. male  with permanent atrial fibrillation, ascending aortic aneurysm, mild hyperlipidemia and angina pectoris.   He moved here from [place], [place]. Has moderate thoracic aortic aneurysm at 4.7 cm that is stable by CT scan in 2019. Has been stable since 2016 and correlates well with echocardiogram. No AAA by CT in 2016 and 2017.   Due to exertional chest pain suggestive of angina pectoris he had undergone nuclear stress test on 10/04/2018 which had revealed inferior wall mild symptoms of ischemia and EF 41%, echocardiogram and revealed EF 45% in June 2020.  Since being on statins and aggressive medical therapy, he has not had any recurrence of angina pectoris.  This is a three-month office visit.  Denies any associated dyspnea, palpitations, dizziness or syncope.  He has chronic low BP always.   Past Medical History:  Diagnosis Date  . Anticoagulated on Coumadin   . Arthritis   . BPH (benign prostatic hyperplasia)   . Cardiomyopathy, dilated, nonischemic ([place])    by 2008 cath and 2013 stress test results; as of 12/28/14: has been followed by Dr. Bernardo Heater in [place] (last seen 11/2014), moved to [facility] 12/2014  . Chronic atrial fibrillation [facility])    cardiologist-  dr Einar Gip---  per pt lov 10/ 2018  . Dilated aortic root (HCC)    4.9 cm by 05/26/14 echo [facility], Dr. Donnal Debar)  . Foley catheter in place   . Permanent atrial fibrillation ([place]) 09/01/2018  . Thoracic ascending aortic aneurysm ([place])    per CT 08-10-2015  4.0cm  . Urinary retention   . Wears glasses     Past Surgical History:  Procedure Laterality Date  . CARDIAC CATHETERIZATION  04/10/2006  [place], [place]    [facility]): Mild plaquing w/o obstructive CAD, LVEF 40-45%, normal right heart pressures.   Marland Kitchen CARDIOVASCULAR STRESS TEST  01-09-2017   DR Einar Gip   Low risk myocardial perfusion study w/ no ischemia/  LV function 45% (but visually appears normal)  . CYSTOSCOPY N/A 01/30/2017   Procedure: CYSTOSCOPY;  Surgeon: Bjorn Loser, MD;  Location: [facility];  Service: Urology;  Laterality: N/A;  . LUMBAR LAMINECTOMY/DECOMPRESSION MICRODISCECTOMY Left 01/03/2015   Procedure: Lumbar four, lumbar five left Laminectomy and Foraminotomy ;  Surgeon: Kary Kos, MD;  Location: [place] [facility];  Service: Neurosurgery;  Laterality: Left;  . TOTAL KNEE ARTHROPLASTY Bilateral 2007;  2003  . TRANSTHORACIC ECHOCARDIOGRAM  09-04-2016   DR Einar Gip   moderate LVH, ef 45%, mild generalized hypokinesis/ due to Afib unable to evaluate diastolic function/  mild LAE/  trivial MR & TR/  mild PR /  moderate dilated aortic root 4.8cm , mild atherosclerotic aorta/  IVC dilated with respiratory variation  . TRANSURETHRAL RESECTION OF PROSTATE N/A 01/30/2017   Procedure: TRANSURETHRAL RESECTION OF THE PROSTATE (TURP);  Surgeon: Bjorn Loser, MD;  Location: [facility];  Service: Urology;  Laterality: N/A;    Social History   Socioeconomic History  . Marital status: Married    Spouse name: Not on file  . Number of children: 0  . Years of education: Not on file  .  Highest education level: Not on file  Occupational History  . Not on file  Social Needs  . Financial resource strain: Not on file  . Food insecurity    Worry: Not on file    Inability: Not on file  . Transportation needs    Medical: Not on file    Non-medical: Not on file  Tobacco Use  . Smoking status: Former Smoker    Years: 15.00    Types: Cigars    Quit date: 01/24/2015    Years since quitting: 3.9  . Smokeless tobacco: Never Used  Substance and Sexual Activity  . Alcohol use: Yes    Alcohol/week: 2.0 standard  drinks    Types: 2 Cans of beer per week    Comment: beer occ  . Drug use: No  . Sexual activity: Not on file  Lifestyle  . Physical activity    Days per week: Not on file    Minutes per session: Not on file  . Stress: Not on file  Relationships  . Social Herbalist on phone: Not on file    Gets together: Not on file    Attends religious service: Not on file    Active member of club or organization: Not on file    Attends meetings of clubs or organizations: Not on file    Relationship status: Not on file  . Intimate partner violence    Fear of current or ex partner: Not on file    Emotionally abused: Not on file    Physically abused: Not on file    Forced sexual activity: Not on file  Other Topics Concern  . Not on file  Social History Narrative  . Not on file    Current Outpatient Medications on File Prior to Visit  Medication Sig Dispense Refill  . atenolol (TENORMIN) 50 MG tablet Take 50 mg by mouth every evening.     Marland Kitchen atorvastatin (LIPITOR) 10 MG tablet Take 0.5 tablets (5 mg total) by mouth daily. As directed 90 tablet 1  . cephALEXin (KEFLEX) 500 MG capsule Take 1 capsule (500 mg total) by mouth 3 (three) times daily. 30 capsule 0  . Cholecalciferol (VITAMIN D) 2000 UNITS CAPS Take 2,000 Units every evening by mouth.     . Cyanocobalamin 1000 MCG/ML KIT Inject 1 mL as directed every 30 (thirty) days.     . folic acid (FOLVITE) 1 MG tablet Take 1 mg every evening by mouth.     . nitroGLYCERIN (NITROSTAT) 0.4 MG SL tablet Place 1 tablet (0.4 mg total) under the tongue every 5 (five) minutes as needed for up to 25 days for chest pain. 25 tablet 3  . tamsulosin (FLOMAX) 0.4 MG CAPS capsule Take 0.4 mg every evening by mouth.  10  . trimethoprim (TRIMPEX) 100 MG tablet Take 100 mg by mouth daily.    Marland Kitchen warfarin (COUMADIN) 6 MG tablet Take 6 mg every evening by mouth.      No current facility-administered medications on file prior to visit.    Review of Systems   Constitution: Negative for decreased appetite, malaise/fatigue, weight gain and weight loss.  Eyes: Negative for visual disturbance.  Cardiovascular: Negative for chest pain, claudication, dyspnea on exertion, leg swelling, orthopnea, palpitations and syncope.  Respiratory: Negative for hemoptysis and wheezing.   Endocrine: Negative for cold intolerance and heat intolerance.  Hematologic/Lymphatic: Does not bruise/bleed easily.  Skin: Negative for nail changes.  Musculoskeletal: Negative for muscle weakness and  myalgias.  Gastrointestinal: Negative for abdominal pain, change in bowel habit, nausea and vomiting.  Neurological: Negative for difficulty with concentration, dizziness, focal weakness and headaches.  Psychiatric/Behavioral: Negative for altered mental status and suicidal ideas.  All other systems reviewed and are negative.     Objective  Blood pressure (!) 102/50, pulse (!) 40, temperature (!) 97.3 F (36.3 C), height 6' 3" (1.905 m), weight 274 lb 4.8 oz (124.4 kg), SpO2 97 %. Body mass index is 34.29 kg/m.    Physical Exam  Constitutional: He is oriented to person, place, and time. Vital signs are normal. He appears well-developed.  Mildly obese  HENT:  Head: Normocephalic and atraumatic.  Neck: Normal range of motion.  Cardiovascular: Normal rate, normal heart sounds and intact distal pulses. An irregular rhythm present.  Pulmonary/Chest: Effort normal and breath sounds normal. No accessory muscle usage. No respiratory distress.  Abdominal: Soft. Bowel sounds are normal.  Musculoskeletal: Normal range of motion.  Neurological: He is alert and oriented to person, place, and time.  Skin: Skin is warm and dry.  Vitals reviewed.  Radiology: No results found.  Laboratory examination:   Labs 06/23/2018: HB 16.7/HCT 48.8, platelets 170, normal indicis.  Potassium 4.4, serum glucose 73 mg, CMP normal, BUN 17, creatinine 1.12, eGFR greater than 60 ml.  TSH normal, B12  normal.  Total cholesterol 182, triglycerides 111, HDL 46, LDL 114.  Labs 06/10/2017: Total cholesterol 171, triglycerides 139, HDL 43, LDL 104. HB 17.2/HCT 51.0, platelets 205. Serum glucose 77 mg, BUN 16, creatinine 1.18, eGFR 61/71 mL, potassium 4.2. CMP normal.  CMP Latest Ref Rng & Units 12/25/2018 02/26/2017 02/22/2017  Glucose 70 - 99 mg/dL 103(H) 103(H) 115(H)  BUN 8 - 23 mg/dL _0 Creatinine 0.61 - 1.24 mg/dL 1.35(H) 1.08 1.20  Sodium 135 - 145 mmol/L 139 138 139  Potassium 3.5 - 5.1 mmol/L 3.9 4.0 3.7  Chloride 98 - 111 mmol/L 106 107 107  CO2 22 - 32 mmol/L 25 25 -  Calcium 8.9 - 10.3 mg/dL 8.6(L) 8.5(L) -  Total Protein 6.5 - 8.1 g/dL 6.9 - -  Total Bilirubin 0.3 - 1.2 mg/dL 1.3(H) - -  Alkaline Phos 38 - 126 U/L 62 - -  AST 15 - 41 U/L 22 - -  ALT 0 - 44 U/L 26 - -   CBC Latest Ref Rng & Units 12/25/2018 02/26/2017 02/22/2017  WBC 4.0 - 10.5 K/uL 9.5 9.3 -  Hemoglobin 13.0 - 17.0 g/dL 15.8 13.7 14.3  Hematocrit 39.0 - 52.0 % 48.3 39.7 42.0  Platelets 150 - 400 K/uL 175 202 -   Lipid Panel     Component Value Date/Time   CHOL 118 10/05/2018 0855   TRIG 97 10/05/2018 0855   HDL 44 10/05/2018 0855   LDLCALC 55 10/05/2018 0855   HEMOGLOBIN A1C No results found for: HGBA1C, MPG TSH No results for input(s): TSH in the last 8760 hours.  Cardiac Studies:   Thoracic CTA 09/15/2017: Stable aneurysmal disease of the ascending thoracic aorta with maximal diameter of 4.7 cm at the level of the sinuses of Valsalva and 4.2 cm in the tubular ascending thoracic aorta.Ascending thoracic aortic aneurysm. Recommend semi-annual imaging followup by CTA or MRA. No significant change from 08/10/2015 and 06/06/2014. Normal caliber abdominal aorta. No significant atherosclerotic plaque. Hepatic simple cysts. Cholelithiasis with mild gallbladder distention but no biliary dilatation. Colonic diverticular disease. Lumbar degenerative disc disease.  Echocardiogram 09/17/2018: Mildly  depressed LV systolic function with EF  45%. Left ventricle cavity is normal in size. Moderate concentric hypertrophy of the left ventricle. Normal global wall motion. Unable to evaluate diastolic function due to A. Fibrillation. Left atrial cavity is severely dilated. Mild (Grade I) mitral regurgitation. Dilated ascending aorta measuring 4.7 cm. Simple liver cyst measuring 5.5 cm. No significant change compared to previous study on 03/10/2017.   Lexiscan Myoview Stress Test 10/04/2018: Resting EKG demonstrates atrial fibrillation with rapid ventricular response, leftward axis, incomplete right bundle branch block, PVC.  Stress EKG is nondiagnostic for ischemia as a pharmacologic stress test. There is a moderate-sized mild inferior wall ischemia extending from the base towards the apex in the distribution of right coronary artery.  Left ventricular systolic function calculated by QGS was 41% with inferior hypokinesis. High risk study.  Assessment     ICD-10-CM   1. Angina pectoris (HCC)  I20.9 EKG 12-Lead    PCV ECHOCARDIOGRAM COMPLETE  2. Permanent atrial fibrillation  I48.21 EKG 12-Lead   CHA2DS2-VASc Score is 2 with yearly risk of stroke of 2.3%.  3. Thoracic aortic aneurysm without rupture ([place])  I71.2 PCV ECHOCARDIOGRAM COMPLETE    EKG 01/13/2019: Atrial fibrillation with controlled ventricular response at the rate of 84 bpm, normal axis.  Nonspecific T abnormality. No significant change from  EKG 09/01/18  Recommendations:   He is here on a three-month office visit and follow-up of angina pectoris and also descending thoracic aneurysm and permanent atrial fibrillation.  Blood pressure is well controlled, no change in his physical exam, since last office visit he has not used any sublingual nitroglycerin and he remained stable.  We again discussed regarding weight loss.  Advised him that we'll continue medical therapy for now for his abnormal stress test and mildly reduced LVEF and  symptoms suggestive of angina pectoris which is improved on medical therapy and he is also tolerating statins well.  I'll repeat echocardiogram in 6 months and see him back at that time.  Adrian Prows, MD, [facility] 01/13/2019, 9:51 AM [facility] [place] Pager: [phone] Office: [phone] If no answer Cell [phone]

## 2019-06-29 ENCOUNTER — Ambulatory Visit (INDEPENDENT_AMBULATORY_CARE_PROVIDER_SITE_OTHER): Payer: Medicare Other | Admitting: Nurse Practitioner

## 2019-06-29 ENCOUNTER — Encounter: Payer: Self-pay | Admitting: Nurse Practitioner

## 2019-06-29 ENCOUNTER — Telehealth: Payer: Self-pay | Admitting: Nurse Practitioner

## 2019-06-29 VITALS — BP 124/68 | HR 90 | Temp 98.7°F | Ht 74.0 in | Wt 266.0 lb

## 2019-06-29 DIAGNOSIS — R197 Diarrhea, unspecified: Secondary | ICD-10-CM | POA: Diagnosis not present

## 2019-06-29 NOTE — Patient Instructions (Signed)
If you are age 75 or older, your body mass index should be between 23-30. Your Body mass index is 34.15 kg/m. If this is out of the aforementioned range listed, please consider follow up with your Primary Care Provider.  If you are age 68 or younger, your body mass index should be between 19-25. Your Body mass index is 34.15 kg/m. If this is out of the aformentioned range listed, please consider follow up with your Primary Care Provider.   We are requesting a copy of your colonoscopy report.  Follow up as needed.  Thank you for choosing me and [facility].   Willette Cluster, NP

## 2019-06-29 NOTE — Telephone Encounter (Signed)
Pt called to inform that his previous colonoscopy was performed by Dr. Karena Addison in [facility].

## 2019-06-29 NOTE — Progress Notes (Addendum)
ASSESSMENT / PLAN:   74 year old male with history of atrial fibrillation on warfarin, hyperlipidemia, nonischemic cardiomyopathy, AAA (4.7 cm) and arthritis   #Acute non-bloody diarrhea, resolved.   --Likely secondary to supplements including magnesium maleate 1350 mg.  Infectious etiology not excluded but seems less likely --Diarrhea has resolved off supplements, back to normal bowel habits --Patient will call if he has recurrent diarrhea  #Colon cancer screening --Reports normal colonoscopy in [place] 2 to 3 years ago.  Will request report  Addendum received colonoscopy report from [facility] Colonoscopy with polypectomy performed 03/04/2016 by [name], MD Exam was complete with a good prep.  3 polyps were removed ranging in 3 to 6 mm in size, internal hemorrhoids and mild diverticulosis found.  Polyps were tubular adenomas.  Based on findings patient will probably be due for surveillance colonoscopy around November 2022 but I will defer to Dr. Loletha Carrow given patient's age  HPI:     Chief Complaint: Diarrhea   ** History comes from chart and   Christopher Goodwin is a 74 year old male, new to the practice, self-referred for evaluation of diarrhea which he says may be self-inflicted.  Several weeks ago he sought treatment for eczema at an integrative health clinic.  He was prescribed several supplements including but not limited to magnesium maleate 1350 mg and thyroid grain.  He subsequently developed severe watery, nonbloody diarrhea.  Prior to this patient says his bowel movements were completely normal.  He called the clinic, was advised to hold supplements and follow a bland diet.  Advised to restart supplements when feeling better.  Since being off supplements the diarrhea has continuously improved.  In fact his bowel movements are back to normal now.  Patient had no associated abdominal pain, nausea or vomiting.  No fevers.  He takes Macrodantin on a daily basis  for UTI prophylaxis.  Within the last few months he has had another course of antibiotics for UTI.   Labs drawn at integrative health clinic on 04/22/2019 : BUN 22, creatinine 1.23, liver test normal, TIBC 276, iron saturation 32%, hemoglobin A1c 5.4, CRP 1.55, TSH 3.02, free T3 2.6,  free T4 1.14  Patient and his wife recently relocated from [facility].  He gives a very remote history of a colon polyp ([facility]) maybe 30 years ago.  His last colonoscopy was 2 to 3 years ago in [facility].  He says the colonoscopy was normal.  Past Medical History:  Diagnosis Date  . Anticoagulated on Coumadin   . Arthritis   . BPH (benign prostatic hyperplasia)   . Cardiomyopathy, dilated, nonischemic ([place])    by 2008 cath and 2013 stress test results; as of 12/28/14: has been followed by Dr. Bernardo Heater in [place] (last seen 11/2014), moved to [facility] 12/2014  . Chronic atrial fibrillation [facility])    cardiologist-  dr Einar Gip---  per pt lov 10/ 2018  . Dilated aortic root (HCC)    4.9 cm by 05/26/14 echo [facility], Dr. Donnal Debar)  . Foley catheter in place   . Permanent atrial fibrillation ([place]) 09/01/2018  . Thoracic ascending aortic aneurysm ([place])    per CT 08-10-2015  4.0cm  . Urinary retention   . Wears glasses      Past Surgical History:  Procedure Laterality Date  . CARDIAC CATHETERIZATION  04/10/2006  [place],     [facility]): Mild plaquing w/o obstructive CAD,  LVEF 40-45%, normal right heart pressures.   Marland Kitchen CARDIOVASCULAR STRESS TEST  01-09-2017   DR Einar Gip   Low risk myocardial perfusion study w/ no ischemia/  LV function 45% (but visually appears normal)  . CYSTOSCOPY N/A 01/30/2017   Procedure: CYSTOSCOPY;  Surgeon: Bjorn Loser, MD;  Location: [facility];  Service: Urology;  Laterality: N/A;  . LUMBAR LAMINECTOMY/DECOMPRESSION MICRODISCECTOMY Left 01/03/2015   Procedure: Lumbar four, lumbar five left Laminectomy and  Foraminotomy ;  Surgeon: Kary Kos, MD;  Location: [place] [facility];  Service: Neurosurgery;  Laterality: Left;  . TOTAL KNEE ARTHROPLASTY Bilateral 2007;  2003  . TRANSTHORACIC ECHOCARDIOGRAM  09-04-2016   DR Einar Gip   moderate LVH, ef 45%, mild generalized hypokinesis/ due to Afib unable to evaluate diastolic function/  mild LAE/  trivial MR & TR/  mild PR /  moderate dilated aortic root 4.8cm , mild atherosclerotic aorta/  IVC dilated with respiratory variation  . TRANSURETHRAL RESECTION OF PROSTATE N/A 01/30/2017   Procedure: TRANSURETHRAL RESECTION OF THE PROSTATE (TURP);  Surgeon: Bjorn Loser, MD;  Location: [facility];  Service: Urology;  Laterality: N/A;   Family History  Problem Relation Age of Onset  . Hypertension Mother   . Diabetes Brother    Social History   Tobacco Use  . Smoking status: Former Smoker    Years: 15.00    Types: Cigars    Quit date: 01/24/2015    Years since quitting: 4.4  . Smokeless tobacco: Never Used  Substance Use Topics  . Alcohol use: Yes    Alcohol/week: 2.0 standard drinks    Types: 2 Cans of beer per week    Comment: beer occ  . Drug use: No   Current Outpatient Medications  Medication Sig Dispense Refill  . atenolol (TENORMIN) 50 MG tablet Take 50 mg by mouth every evening.     Marland Kitchen atorvastatin (LIPITOR) 10 MG tablet Take 0.5 tablets (5 mg total) by mouth daily. As directed 90 tablet 1  . Cholecalciferol (VITAMIN D) 2000 UNITS CAPS Take 2,000 Units every evening by mouth.     . Cyanocobalamin 1000 MCG/ML KIT Inject 1 mL as directed every 30 (thirty) days.     . folic acid (FOLVITE) 1 MG tablet Take 1 mg every evening by mouth.     . nitrofurantoin (MACRODANTIN) 100 MG capsule Take 100 mg by mouth 2 (two) times daily.    . tamsulosin (FLOMAX) 0.4 MG CAPS capsule Take 0.4 mg every evening by mouth.  10  . warfarin (COUMADIN) 6 MG tablet Take 6 mg every evening by mouth.     . nitroGLYCERIN (NITROSTAT) 0.4 MG SL tablet Place 1  tablet (0.4 mg total) under the tongue every 5 (five) minutes as needed for up to 25 days for chest pain. 25 tablet 3   No current facility-administered medications for this visit.   No Known Allergies   Review of Systems: Positive for excessive urination  All other systems reviewed and negative except where noted in HPI.   Creatinine clearance cannot be calculated (Patient's most recent lab result is older than the maximum 21 days allowed.)   Physical Exam:    Wt Readings from Last 3 Encounters:  06/29/19 266 lb (120.7 kg)  01/13/19 274 lb 4.8 oz (124.4 kg)  12/25/18 281 lb (127.5 kg)    BP 124/68   Pulse 90   Temp 98.7 F (37.1 C)   Ht '6\' 2"'  (1.88 m)   Wt 266  lb (120.7 kg)   SpO2 95%   BMI 34.15 kg/m  Constitutional:  Pleasant male in no acute distress. Psychiatric: Normal mood and affect. Behavior is normal. EENT: Pupils normal.  Conjunctivae are normal. No scleral icterus. Neck supple.  Cardiovascular: Normal rate, regular rhythm. No edema Pulmonary/chest: Effort normal and breath sounds normal. No wheezing, rales or rhonchi. Abdominal: Soft, nondistended, nontender. Bowel sounds active throughout. There are no masses palpable but RLQ fullness. No hepatomegaly. Neurological: Alert and oriented to person place and time. Skin: Skin is warm and dry. No rashes noted.  Tye Savoy, NP  06/29/2019, 9:53 AM

## 2019-07-01 NOTE — Progress Notes (Signed)
____________________________________________________________  Attending physician addendum:  Thank you for sending this case to me. I have reviewed the entire note, and the outlined plan seems appropriate.  Regarding his colon polyp recall, please put him in for November 2022 as you suggested, at which time we will reevaluate his health and see if surveillance colonoscopy is appropriate.  Amada Jupiter, MD  ____________________________________________________________

## 2019-07-04 NOTE — Progress Notes (Signed)
Beth, please let patient know we reviewed records. Please put him on for 5 year recall colonoscopy in Nov 2022. Thanks

## 2019-07-04 NOTE — Progress Notes (Signed)
Recall in for November 2022.

## 2019-07-14 ENCOUNTER — Other Ambulatory Visit: Payer: Self-pay

## 2019-07-14 ENCOUNTER — Ambulatory Visit: Payer: Medicare Other

## 2019-07-14 DIAGNOSIS — I209 Angina pectoris, unspecified: Secondary | ICD-10-CM

## 2019-07-14 DIAGNOSIS — I712 Thoracic aortic aneurysm, without rupture, unspecified: Secondary | ICD-10-CM

## 2019-07-21 ENCOUNTER — Ambulatory Visit: Payer: Medicare Other | Admitting: Cardiology

## 2019-07-28 ENCOUNTER — Ambulatory Visit (INDEPENDENT_AMBULATORY_CARE_PROVIDER_SITE_OTHER): Payer: Medicare Other | Admitting: Nurse Practitioner

## 2019-07-28 ENCOUNTER — Other Ambulatory Visit (INDEPENDENT_AMBULATORY_CARE_PROVIDER_SITE_OTHER): Payer: Medicare Other

## 2019-07-28 ENCOUNTER — Encounter: Payer: Self-pay | Admitting: Nurse Practitioner

## 2019-07-28 VITALS — BP 90/60 | HR 56 | Temp 97.8°F | Ht 73.0 in | Wt 257.2 lb

## 2019-07-28 DIAGNOSIS — R194 Change in bowel habit: Secondary | ICD-10-CM | POA: Diagnosis not present

## 2019-07-28 DIAGNOSIS — I209 Angina pectoris, unspecified: Secondary | ICD-10-CM

## 2019-07-28 LAB — COMPREHENSIVE METABOLIC PANEL
ALT: 32 U/L (ref 0–53)
AST: 23 U/L (ref 0–37)
Albumin: 4.1 g/dL (ref 3.5–5.2)
Alkaline Phosphatase: 82 U/L (ref 39–117)
BUN: 13 mg/dL (ref 6–23)
CO2: 27 mEq/L (ref 19–32)
Calcium: 8.7 mg/dL (ref 8.4–10.5)
Chloride: 107 mEq/L (ref 96–112)
Creatinine, Ser: 1.06 mg/dL (ref 0.40–1.50)
GFR: 68.22 mL/min (ref >60.00)
Glucose, Bld: 102 mg/dL — ABNORMAL HIGH (ref 70–99)
Potassium: 3.5 mEq/L (ref 3.5–5.1)
Sodium: 142 mEq/L (ref 135–145)
Total Bilirubin: 0.9 mg/dL (ref 0.2–1.2)
Total Protein: 7.1 g/dL (ref 6.0–8.3)

## 2019-07-28 LAB — CBC WITH DIFFERENTIAL/PLATELET
Basophils Absolute: 0 10*3/uL (ref 0.0–0.1)
Basophils Relative: 0.6 % (ref 0.0–3.0)
Eosinophils Absolute: 0.3 10*3/uL (ref 0.0–0.7)
Eosinophils Relative: 4.1 % (ref 0.0–5.0)
HCT: 45.7 % (ref 39.0–52.0)
Hemoglobin: 15.2 g/dL (ref 13.0–17.0)
Lymphocytes Relative: 18.6 % (ref 12.0–46.0)
Lymphs Abs: 1.4 10*3/uL (ref 0.7–4.0)
MCHC: 33.2 g/dL (ref 30.0–36.0)
MCV: 89.5 fl (ref 78.0–100.0)
Monocytes Absolute: 0.4 10*3/uL (ref 0.1–1.0)
Monocytes Relative: 5.4 % (ref 3.0–12.0)
Neutro Abs: 5.2 10*3/uL (ref 1.4–7.7)
Neutrophils Relative %: 71.3 % (ref 43.0–77.0)
Platelets: 192 10*3/uL (ref 150.0–400.0)
RBC: 5.11 Mil/uL (ref 4.22–5.81)
RDW: 14.7 % (ref 11.5–15.5)
WBC: 7.3 10*3/uL (ref 4.0–10.5)

## 2019-07-28 NOTE — Patient Instructions (Signed)
If you are age 75 or older, your body mass index should be between 23-30. Your Body mass index is 33.94 kg/m. If this is out of the aforementioned range listed, please consider follow up with your Primary Care Provider.  If you are age 29 or younger, your body mass index should be between 19-25. Your Body mass index is 33.94 kg/m. If this is out of the aformentioned range listed, please consider follow up with your Primary Care Provider.   Your provider has requested that you go to the basement level for lab work before leaving today. Press "B" on the elevator. The lab is located at the first door on the left as you exit the elevator.  Due to recent changes in healthcare laws, you may see the results of your imaging and laboratory studies on MyChart before your provider has had a chance to review them.  We understand that in some cases there may be results that are confusing or concerning to you. Not all laboratory results come back in the same time frame and the provider may be waiting for multiple results in order to interpret others.  Please give [place] 48 hours in order for your provider to thoroughly review all the results before contacting the office for clarification of your results.   START Align Probiotic as directed on the box for 30 days. This is an over the counter.  Call the office when you get back from the beach to let us know how you are doing.

## 2019-07-28 NOTE — Progress Notes (Signed)
IMPRESSION and PLAN:    74 year old male with history of atrial fibrillation on warfarin, hyperlipidemia, nonischemic cardiomyopathy, AAA (4.7 cm) and arthritis  # Bowel changes. Etiology unclear --Diarrhea has resolved. He was having numerous loose stools randomly throughout the day after starting a regimen of supplements ( now discontinued).  Now stools are solid but of small caliber and occurring only postprandially but only with certain foods. --Trial of Align probiotic --Judicious use of Imodium as only one dose had a two day effect on him.  --Patient and wife headed to [place] for vacation next week. If bowel habits not returning to normal upon return then will probably proceed with colonoscopy.     # Weight loss --Down from 274 pounds in October 2020 to 257 pounds.  --A large part of the weight loss is probably due to omission of snacks / diet alterations because of the diarrhea.  --update labs with CBC, CMET --monitor weight  # History of colon polyps --Surveillance colonoscopy dur Nov 2022. May need to proceed earlier depending on clinical course.    HPI:    Primary GI: Dr. Loletha Carrow  Chief complaint : follow up on diarrhea  Patient has return for follow-up as requested her last visit on 06/29/19.  Since discontinuation of the supplement regimen the diarrhea has resolved.  However, he is still having postprandial bowel movements with certain foods.  Though the stools are now solid they are small in caliber.  No blood in stool.  No abdominal pain.  No fevers.  Overall he feels okay.  He took 1 Imodium and did not have a bowel movement for nearly 2 days.  Patient has lost several pounds over the last few months.  He attributes that to omission of snacks but also avoidance of many of his favorite foods out of fear for postprandial diarrhea.  If sticks to a bland diet he has no postprandial bowel movements.   Review of systems:     No chest pain, no SOB, no  fevers, no urinary sx   Past Medical History:  Diagnosis Date  . Anticoagulated on Coumadin   . Arthritis   . BPH (benign prostatic hyperplasia)   . Cardiomyopathy, dilated, nonischemic ([place])    by 2008 cath and 2013 stress test results; as of 12/28/14: has been followed by Dr. Bernardo Heater in [place] (last seen 11/2014), moved to [facility] 12/2014  . Chronic atrial fibrillation [facility])    cardiologist-  dr Einar Gip---  per pt lov 10/ 2018  . Dilated aortic root (HCC)    4.9 cm by 05/26/14 echo [facility], Dr. Donnal Debar)  . Foley catheter in place   . Permanent atrial fibrillation ([place]) 09/01/2018  . Thoracic ascending aortic aneurysm ([place])    per CT 08-10-2015  4.0cm  . Urinary retention   . Wears glasses     Patient's surgical history, family medical history, social history, medications and allergies were all reviewed in Epic   Creatinine clearance cannot be calculated (Patient's most recent lab result is older than the maximum 21 days allowed.)  Current Outpatient Medications  Medication Sig Dispense Refill  . atenolol (TENORMIN) 50 MG tablet Take 50 mg by mouth every evening.     Marland Kitchen atorvastatin (LIPITOR) 10 MG tablet Take 0.5 tablets (5 mg total) by mouth daily. As directed 90 tablet 1  . Cholecalciferol (VITAMIN D) 2000 UNITS CAPS Take 2,000 Units every evening by mouth.     Marland Kitchen  Cyanocobalamin 1000 MCG/ML KIT Inject 1 mL as directed every 30 (thirty) days.     . folic acid (FOLVITE) 1 MG tablet Take 1 mg every evening by mouth.     . nitrofurantoin (MACRODANTIN) 100 MG capsule Take 100 mg by mouth 2 (two) times daily.    . tamsulosin (FLOMAX) 0.4 MG CAPS capsule Take 0.4 mg every evening by mouth.  10  . warfarin (COUMADIN) 6 MG tablet Take 6 mg every evening by mouth.     . nitroGLYCERIN (NITROSTAT) 0.4 MG SL tablet Place 1 tablet (0.4 mg total) under the tongue every 5 (five) minutes as needed for up to 25 days for chest pain. (Patient not taking: Reported on  07/28/2019) 25 tablet 3   No current facility-administered medications for this visit.    Physical Exam:     BP 90/60 (BP Location: Left Arm, Patient Position: Sitting, Cuff Size: Normal)   Pulse (!) 56 Comment: irregular  Temp 97.8 F (36.6 C)   Ht '6\' 1"'  (1.854 m) Comment: height measured without shoes  Wt 257 lb 4 oz (116.7 kg)   BMI 33.94 kg/m   GENERAL:  Pleasant male in NAD PSYCH: : Cooperative, normal affect CARDIAC:  RRR,  PULM: Normal respiratory effort, lungs CTA bilaterally, no wheezing ABDOMEN:  Nondistended, soft, nontender. No obvious masses, no hepatomegaly,  normal bowel sounds Musculoskeletal:  Normal muscle tone, normal strength NEURO: Alert and oriented x 3, no focal neurologic deficits   Tye Savoy , NP 07/28/2019, 11:36 AM

## 2019-08-02 NOTE — Progress Notes (Signed)
____________________________________________________________  Attending physician addendum:  Thank you for sending this case to me. I have reviewed the entire note, and the outlined plan seems appropriate.  Appreciate the update, and glad he is better.  Amada Jupiter, MD  ____________________________________________________________

## 2019-08-24 ENCOUNTER — Telehealth: Payer: Self-pay | Admitting: Nurse Practitioner

## 2019-08-24 NOTE — Telephone Encounter (Signed)
Pt requested to speak to a nurse to give an update on his condition.

## 2019-08-24 NOTE — Telephone Encounter (Signed)
Patient calls with an update reporting he "hardly ever has diarrhea anymore." Pressed for specifics, he states "maybe once or twice in a week." He notes the stools are always "yellow to tan" in color. He has a consistent diet and does not take any supplements. He complains of excessive gas. No abdominal discomfort. Please advise.

## 2019-08-25 NOTE — Telephone Encounter (Signed)
Beth,  Please talk with him about gas producing foods. Can try Gas-X as needed. Since diarrhea has resolved I think we can hold off on colonoscopy. I think his next one is due Nov 2022. Please make sure though that his weight has stabilized. I am always happy to see then back if ongoing concerns. Thanks

## 2019-08-25 NOTE — Telephone Encounter (Signed)
Patient is advised of the advice. Discussed elimination diet to find the offending foods. Gas-X and Phazyme to alleviate gas.

## 2019-09-01 ENCOUNTER — Encounter: Payer: Self-pay | Admitting: Cardiology

## 2019-09-01 ENCOUNTER — Other Ambulatory Visit: Payer: Self-pay

## 2019-09-01 ENCOUNTER — Ambulatory Visit: Payer: Medicare Other | Admitting: Cardiology

## 2019-09-01 VITALS — BP 101/58 | HR 65 | Ht 73.0 in | Wt 258.0 lb

## 2019-09-01 DIAGNOSIS — I712 Thoracic aortic aneurysm, without rupture, unspecified: Secondary | ICD-10-CM

## 2019-09-01 DIAGNOSIS — E785 Hyperlipidemia, unspecified: Secondary | ICD-10-CM

## 2019-09-01 DIAGNOSIS — I4821 Permanent atrial fibrillation: Secondary | ICD-10-CM

## 2019-09-01 DIAGNOSIS — I209 Angina pectoris, unspecified: Secondary | ICD-10-CM

## 2019-09-01 NOTE — Progress Notes (Signed)
Primary Physician/Referring:  Javier Glazier, MD  Patient ID: Christopher Goodwin, male    DOB: 02-Dec-1945, 74 y.o.   MRN: [id number]  Chief Complaint  Patient presents with  . Follow-up    6 month   . Atrial Fibrillation  . Chest Pain  . Thoracic ascending aortic aneurysm    HPI:    Christopher Goodwin  is a 74 y.o. male  with permanent atrial fibrillation, ascending aortic aneurysm, mild hyperlipidemia and angina pectoris.   He moved here from [place], [place]. Has moderate thoracic aortic aneurysm at 4.7 cm that is stable by CT scan in 2019. Has been stable since 2016 and correlates well with echocardiogram. No Abdominal aortic aneurysm by CT in 2016 and 2017.   Due to exertional chest pain suggestive of angina pectoris he had undergone nuclear stress test on 10/04/2018 which had revealed inferior wall mild ischemia and EF 41%, echocardiogram and revealed EF 45% in June 2020.  Since being on statins and aggressive medical therapy, he has not had any recurrence of angina pectoris.  He now presents for 95-monthfollow-up of angina and also AAA, essentially asymptomatic.  He has not used any sublingual nitroglycerin.  Past Medical History:  Diagnosis Date  . Anticoagulated on Coumadin   . Arthritis   . BPH (benign prostatic hyperplasia)   . [place]omy[place], [place]mic [place]    by 2008 cath and 2013 stress test results; as of 12/28/14: [place] [facility]  . [facility] fibrillation ([facility]    cardiologist-  dr gEinar Gip--  per pt lov 10/ 2018  . Dilated aortic root (HCC)    4.9 cm by [facility]/26/14 [facility] Dr[facility] H[facility]nal Debar  . Foley catheter in place   . [place]ermanent atrial fibrillation ([place] 09/01/2018  . [place]    per [place] 08-10-2015  4.0cm  . Urinary retention   . Wears glasses    Past Surgical History:  Procedure Laterality Date  . CARDIAC  CATHETERIZATION  04/10/2006  [place], [place]    ([facility]: Mild plaquing w/o obstructive CAD, LVEF 40-45%, normal right heart pressures.   .Marland KitchenCARDIOVASCULAR STRESS TEST  01-09-2017   DR GEinar Gip  Low risk myocardial perfusion study w/ no ischemia/  LV function 45% (but visually appears normal)  . CYSTOSCOPY N/A 01/30/2017   Procedure: CYSTOSCOPY;  Surgeon: MBjorn Loser MD;  [facility]  Service: Urology;  Laterality: N/A;  . LUMBAR LAMINECTOMY/DECOMPRESSION MICRODISCECTOMY Left 01/03/2015   Procedure: Lumbar four, lumbar five left Laminectomy and Foraminotomy ;  Surgeon: GKary Kos MD;  [facility];  Service: Neurosurgery;  Laterality: Left;  . TOTAL KNEE ARTHROPLASTY Bilateral 2007;  2003  . TRANSTHORACIC ECHOCARDIOGRAM  09-04-2016   DR GEinar Gip  moderate LVH, ef 45%, mild generalized hypokinesis/ due to Afib unable to evaluate diastolic function/  mild LAE/  trivial MR & TR/  mild PR /  moderate dilated aortic root 4.8cm , mild atherosclerotic aorta/  IVC dilated with respiratory variation  . TRANSURETHRAL RESECTION OF PROSTATE N/A 01/30/2017   Procedure: TRANSURETHRAL RESECTION OF THE PROSTATE (TURP);  Surgeon: MBjorn Loser MD;  [facility]  Service: Urology;  Laterality: N/A;   Family History  Problem Relation Age of Onset  . Hypertension Mother   . Diabetes Brother     Social History   Tobacco Use  .  Smoking status: Former Smoker    Years: 15.00    Types: Cigars    Quit date: 01/24/2015    Years since quitting: 4.6  . Smokeless tobacco: Never Used  Substance Use Topics  . Alcohol use: Yes    Alcohol/week: 2.0 standard drinks    Types: 2 Cans of beer per week    Comment: beer occ   Marital Status: Married  ROS  Review of Systems  Cardiovascular: Negative for dyspnea on exertion, leg swelling and syncope.  Respiratory: Negative for shortness of breath.   Gastrointestinal: Negative for melena.    Objective  Blood pressure (!) 101/58, pulse 65, height _0  (1.854 m), weight 258 lb (117 kg), SpO2 95 %.  Vitals with BMI 09/01/2019 07/28/2019 06/29/2019  Height _1  _2  _3   Weight 258 lbs 257 lbs 4 oz 266 lbs  BMI 34.05 70.78 67.54  Systolic 492 90 010  Diastolic 58 60 68  Pulse 65 56 90     Physical Exam  Constitutional: He appears well-developed and well-nourished. No distress.  Cardiovascular: Normal rate and intact distal pulses. An irregularly irregular rhythm present. Exam reveals no gallop.  No murmur heard. No leg edema, no JVD.   Pulmonary/Chest: Effort normal and breath sounds normal. No accessory muscle usage.  Abdominal: Soft. Bowel sounds are normal.   Laboratory examination:   Recent Labs    12/25/18 1334 07/28/19 1129  NA 139 142  K 3.9 3.5  CL 106 107  CO2 25 27  GLUCOSE 103* 102*  BUN 20 13  CREATININE 1.35* 1.06  CALCIUM 8.6* 8.7  GFRNONAA 52*  --   GFRAA 60*  --    CrCl cannot be calculated (Patient's most recent lab result is older than the maximum 21 days allowed.).  CMP Latest Ref Rng & Units 07/28/2019 12/25/2018 02/26/2017  Glucose 70 - 99 mg/dL 102(H) 103(H) 103(H)  BUN 6 - 23 mg/dL _4 Creatinine 0.40 - 1.50 mg/dL 1.06 1.35(H) 1.08  Sodium 135 - 145 mEq/L 142 139 138  Potassium 3.5 - 5.1 mEq/L 3.5 3.9 4.0  Chloride 96 - 112 mEq/L 107 106 107  CO2 19 - 32 mEq/L _5 Calcium 8.4 - 10.5 mg/dL 8.7 8.6(L) 8.5(L)  Total Protein 6.0 - 8.3 g/dL 7.1 6.9 -  Total Bilirubin 0.2 - 1.2 mg/dL 0.9 1.3(H) -  Alkaline Phos 39 - 117 U/L 82 62 -  AST 0 - 37 U/L 23 22 -  ALT 0 - 53 U/L 32 26 -   CBC Latest Ref Rng & Units 07/28/2019 12/25/2018 02/26/2017  WBC 4.0 - 10.5 K/uL 7.3 9.5 9.3  Hemoglobin 13.0 - 17.0 g/dL 15.2 15.8 13.7  Hematocrit 39.0 - 52.0 % 45.7 48.3 39.7  Platelets 150.0 - 400.0 K/uL 192.0 175 202   Lipid Panel     Component Value Date/Time   CHOL 118 10/05/2018 0855   TRIG 97 10/05/2018 0855   HDL 44 10/05/2018  0855   LDLCALC 55 10/05/2018 0855   HEMOGLOBIN A1C No results found for: HGBA1C, MPG TSH No results for input(s): TSH in the last 8760 hours.  External labs:  Lipid Panel completed 10/05/2018 HDL 44.000 10/05/2018 LDL-C 55.000 10/05/2018 Cholesterol, total 118.000 10/05/2018 Triglycerides 97.000 10/05/2018  Glucose Random 102.000 07/28/2019  BUN 13.000 07/28/2019 Creatinine, Serum 1.060 07/28/2019  Medications and allergies  No Known Allergies   Current Outpatient Medications  Medication Instructions  . atenolol (TENORMIN) 50 mg, Oral, Every  evening  . atorvastatin (LIPITOR) 5 mg, Oral, Daily, As directed  . cyanocobalamin (,VITAMIN B-12,) 1000 MCG/ML injection INJECT 1ML INTRAMUSCULARLY EVERY 30 DAYS  . folic acid (FOLVITE) 1 mg, Oral, Every evening  . nitrofurantoin (MACRODANTIN) 100 mg, Oral, 2 times daily  . nitrofurantoin, macrocrystal-monohydrate, (MACROBID) 100 MG capsule 1 capsule, Oral, Daily  . nitroGLYCERIN (NITROSTAT) 0.4 mg, Sublingual, Every 5 min PRN  . tamsulosin (FLOMAX) 0.4 mg, Oral, Every evening  . Vitamin D 2,000 Units, Oral, Every evening  . warfarin (COUMADIN) 6 mg, Oral, Every evening   Radiology:   CTA abdomen 08/10/2015: Aorta: Normal caliber abdominal aorta. No significant atherosclerotic vascular calcifications. No evidence of dissection or other acute abnormality.  Thoracic CTA 09/15/2017: Stable aneurysmal disease of the ascending thoracic aorta with maximal diameter of 4.7 cm at the level of the sinuses of Valsalva and 4.2 cm in the tubular ascending thoracic aorta.Ascending thoracic aortic aneurysm. Recommend semi-annual imaging followup by CTA or MRA. No significant change from 08/10/2015 and 06/06/2014. Normal caliber abdominal aorta. No significant atherosclerotic plaque. Hepatic simple cysts. Cholelithiasis with mild gallbladder distention but no biliary dilatation. Colonic diverticular disease. Lumbar degenerative disc disease.  Cardiac Studies:    Lexiscan Myoview Stress Test 10/04/2018: Resting EKG demonstrates atrial fibrillation with rapid ventricular response, leftward axis, incomplete right bundle branch block, PVC.  Stress EKG is nondiagnostic for ischemia as a pharmacologic stress test. There is a moderate-sized mild inferior wall ischemia extending from the base towards the apex in the distribution of right coronary artery.  Left ventricular systolic function calculated by QGS was 41% with inferior hypokinesis. High risk study.  Echocardiogram 07/14/2019:  Left ventricle cavity is normal in size. Mild concentric hypertrophy of the left ventricle. Abnormal septal wall motion due to left bundle branch block. Mildly depressed LV systolic function with visual EF 45-50%. Unable to evaluate diastolic function due to atrial fibrillation.  Left atrial cavity is severely dilated.  Trileaflet aortic valve. Trace aortic regurgitation. Mild (Grade I) mitral regurgitation.  Ascending aorta aneurysm, proximal aorta measuring 4.9 cm. Previous measurement in 09/17/2018 was 4.7 cm.  Incidental finding of 5 cm hypoechoic liver cyst.   EKG  01/13/2019: Atrial fibrillation with controlled ventricular response at the rate of 84 bpm, normal axis.  Nonspecific T abnormality. No significant change from  EKG 09/01/18  Assessment     ICD-10-CM   1. Permanent atrial fibrillation ([place]). CHA2DS2-VASc Score is 2.  Yearly risk of stroke: 2.3% (A, Vasc Dz).  I48.21 EKG [id number]    Basic metabolic panel    TSH    CANCELED: EKG 12-Lead  2. Thoracic aortic aneurysm without rupture (HCC)  I71.2 CT ANGIO CHEST PE W OR WO CONTRAST  3. Mild hyperlipidemia  E78.5   4. Angina pectoris (HCC)  I20.9 TSH     No orders of the defined types were placed in this encounter.   Medications Discontinued During This Encounter  Medication Reason  . Cyanocobalamin 1000 MCG/ML KIT Entry Error    Recommendations:   Christopher Goodwin  is a 74 y.o. male  with permanent atrial  fibrillation, ascending aortic aneurysm, no evidence of abnormality By CT Angiogram in 2017, Mild hyperlipidemia and stable angina pectoris with a nuclear stress test revealing mild inferior ischemia in August 2020.    On aggressive medical therapy has not had recurrence of angina.  He presents for 13-monthvisit, denies any new symptoms, tolerating all his medications well.  I reviewed the results of the echocardiogram, thoracic  aortic aneurysm has minimally, it has been 2 years since last CT scan, I will repeat CT angiogram of the chest to evaluate thoracic aortic aneurysm.  With regard to atrial fibrillation, remains rate controlled on single agent.  Blood pressure is also normal.  He is on appropriate medical therapy and lipids are under excellent control as well.  I will see him back on annual basis unless CT scan is abnormal. Patient is being screened into [facility] Trial for chronic angina.  CHA2DS2-VASc Score is 3.  Yearly risk of stroke: 3.2% (A, HTN, Vasc Dz).  Score of 1=1.3; 2=2.2; 3=3.2; 4=4; 5=6.7; 6=9.8; 7=>9.8) -(CHF; HTN; vasc disease DM,  Male = 1; Age <65 =0; 65-74 = 1,  >75 =2; stroke = 2).    Adrian Prows, MD, [facility] 09/01/2019, 3:05 PM [facility] [place] Pager: [phone] Office: [phone]

## 2019-09-01 NOTE — Progress Notes (Deleted)
Primary Physician/Referring:  Javier Glazier, MD  Patient ID: Christopher Goodwin, male    DOB: 20-Nov-1945, 74 y.o.   MRN: [id number]  Chief Complaint  Patient presents with  . Follow-up    6 month   . Atrial Fibrillation  . Chest Pain  . Thoracic ascending aortic aneurysm     HPI: Christopher Goodwin  is a 74 y.o. male  with permanent atrial fibrillation, ascending aortic aneurysm, mild hyperlipidemia and angina pectoris.   He moved here from [place], [place]. Has moderate thoracic aortic aneurysm at 4.7 cm that is stable by CT scan in 2019. Has been stable since 2016 and correlates well with echocardiogram. No AAA by CT in 2016 and 2017.   Due to exertional chest pain suggestive of angina pectoris he had undergone nuclear stress test on 10/04/2018 which had revealed inferior wall mild symptoms of ischemia and EF 41%, echocardiogram and revealed EF 45% in June 2020.  Since being on statins and aggressive medical therapy, he has not had any recurrence of angina pectoris.  This is a three-month office visit.  Denies any associated dyspnea, palpitations, dizziness or syncope.  He has chronic low BP always.   Past Medical History:  Diagnosis Date  . Anticoagulated on Coumadin   . Arthritis   . BPH (benign prostatic hyperplasia)   . Cardiomyopathy, dilated, nonischemic ([place])    by 2008 cath and 2013 stress test results; as of 12/28/14: has been followed by Dr. Bernardo Heater in [place] (last seen 11/2014), moved to [facility] 12/2014  . Chronic atrial fibrillation [facility])    cardiologist-  dr Einar Gip---  per pt lov 10/ 2018  . Dilated aortic root (HCC)    4.9 cm by 05/26/14 echo [facility], Dr. Donnal Debar)  . Foley catheter in place   . Permanent atrial fibrillation ([place]) 09/01/2018  . Thoracic ascending aortic aneurysm ([place])    per CT 08-10-2015  4.0cm  . Urinary retention   . Wears glasses     Past Surgical History:  Procedure Laterality Date  . CARDIAC CATHETERIZATION   04/10/2006  [place], [place]    [facility]): Mild plaquing w/o obstructive CAD, LVEF 40-45%, normal right heart pressures.   Marland Kitchen CARDIOVASCULAR STRESS TEST  01-09-2017   DR Einar Gip   Low risk myocardial perfusion study w/ no ischemia/  LV function 45% (but visually appears normal)  . CYSTOSCOPY N/A 01/30/2017   Procedure: CYSTOSCOPY;  Surgeon: Bjorn Loser, MD;  Location: [facility];  Service: Urology;  Laterality: N/A;  . LUMBAR LAMINECTOMY/DECOMPRESSION MICRODISCECTOMY Left 01/03/2015   Procedure: Lumbar four, lumbar five left Laminectomy and Foraminotomy ;  Surgeon: Kary Kos, MD;  Location: [place] [facility];  Service: Neurosurgery;  Laterality: Left;  . TOTAL KNEE ARTHROPLASTY Bilateral 2007;  2003  . TRANSTHORACIC ECHOCARDIOGRAM  09-04-2016   DR Einar Gip   moderate LVH, ef 45%, mild generalized hypokinesis/ due to Afib unable to evaluate diastolic function/  mild LAE/  trivial MR & TR/  mild PR /  moderate dilated aortic root 4.8cm , mild atherosclerotic aorta/  IVC dilated with respiratory variation  . TRANSURETHRAL RESECTION OF PROSTATE N/A 01/30/2017   Procedure: TRANSURETHRAL RESECTION OF THE PROSTATE (TURP);  Surgeon: Bjorn Loser, MD;  Location: [facility];  Service: Urology;  Laterality: N/A;    Social History   Socioeconomic History  . Marital status: Married    Spouse name: Not on file  . Number of children: 0  .  Years of education: Not on file  . Highest education level: Not on file  Occupational History  . Occupation: retired   Tobacco Use  . Smoking status: Former Smoker    Years: 15.00    Types: Cigars    Quit date: 01/24/2015    Years since quitting: 4.6  . Smokeless tobacco: Never Used  Substance and Sexual Activity  . Alcohol use: Yes    Alcohol/week: 2.0 standard drinks    Types: 2 Cans of beer per week    Comment: beer occ  . Drug use: No  . Sexual activity: Not on file  Other Topics Concern  . Not on file   Social History Narrative  . Not on file   Social Determinants of Health   Financial Resource Strain:   . Difficulty of Paying Living Expenses:   Food Insecurity:   . Worried About Charity fundraiser in the Last Year:   . Arboriculturist in the Last Year:   Transportation Needs:   . Film/video editor (Medical):   Marland Kitchen Lack of Transportation (Non-Medical):   Physical Activity:   . Days of Exercise per Week:   . Minutes of Exercise per Session:   Stress:   . Feeling of Stress :   Social Connections:   . Frequency of Communication with Friends and Family:   . Frequency of Social Gatherings with Friends and Family:   . Attends Religious Services:   . Active Member of Clubs or Organizations:   . Attends Archivist Meetings:   Marland Kitchen Marital Status:   Intimate Partner Violence:   . Fear of Current or Ex-Partner:   . Emotionally Abused:   Marland Kitchen Physically Abused:   . Sexually Abused:     Current Outpatient Medications on File Prior to Visit  Medication Sig Dispense Refill  . atenolol (TENORMIN) 50 MG tablet Take 50 mg by mouth every evening.     Marland Kitchen atorvastatin (LIPITOR) 10 MG tablet Take 0.5 tablets (5 mg total) by mouth daily. As directed 90 tablet 1  . Cholecalciferol (VITAMIN D) 2000 UNITS CAPS Take 2,000 Units every evening by mouth.     . cyanocobalamin (,VITAMIN B-12,) 1000 MCG/ML injection INJECT 1ML INTRAMUSCULARLY EVERY 30 DAYS    . folic acid (FOLVITE) 1 MG tablet Take 1 mg every evening by mouth.     . nitrofurantoin (MACRODANTIN) 100 MG capsule Take 100 mg by mouth 2 (two) times daily.    . nitrofurantoin, macrocrystal-monohydrate, (MACROBID) 100 MG capsule Take 1 capsule by mouth daily.    . nitroGLYCERIN (NITROSTAT) 0.4 MG SL tablet Place 1 tablet (0.4 mg total) under the tongue every 5 (five) minutes as needed for up to 25 days for chest pain. 25 tablet 3  . tamsulosin (FLOMAX) 0.4 MG CAPS capsule Take 0.4 mg every evening by mouth.  10  . warfarin (COUMADIN) 6  MG tablet Take 6 mg every evening by mouth.      No current facility-administered medications on file prior to visit.   Review of Systems  Cardiovascular: Negative for chest pain, dyspnea on exertion and leg swelling.  Gastrointestinal: Negative for melena.      Objective  Blood pressure (!) 78/60, pulse 65, height '6\' 1"'  (1.854 m), weight 258 lb (117 kg), SpO2 95 %. Body mass index is 34.04 kg/m.    Physical Exam  Constitutional: He is oriented to person, place, and time. Vital signs are normal. He appears well-developed.  Mildly obese  HENT:  Head: Normocephalic and atraumatic.  Cardiovascular: Normal rate, normal heart sounds and intact distal pulses. An irregular rhythm present.  Pulmonary/Chest: Effort normal and breath sounds normal. No accessory muscle usage. No respiratory distress.  Abdominal: Soft. Bowel sounds are normal.  Musculoskeletal:        General: Normal range of motion.     Cervical back: Normal range of motion.  Neurological: He is alert and oriented to person, place, and time.  Skin: Skin is warm and dry.  Vitals reviewed.  Radiology: No results found.  Laboratory examination:   Labs 06/23/2018: HB 16.7/HCT 48.8, platelets 170, normal indicis.  Potassium 4.4, serum glucose 73 mg, CMP normal, BUN 17, creatinine 1.12, eGFR greater than 60 ml.  TSH normal, B12 normal.  Total cholesterol 182, triglycerides 111, HDL 46, LDL 114.  Labs 06/10/2017: Total cholesterol 171, triglycerides 139, HDL 43, LDL 104. HB 17.2/HCT 51.0, platelets 205. Serum glucose 77 mg, BUN 16, creatinine 1.18, eGFR 61/71 mL, potassium 4.2. CMP normal.  CMP Latest Ref Rng & Units 07/28/2019 12/25/2018 02/26/2017  Glucose 70 - 99 mg/dL 102(H) 103(H) 103(H)  BUN 6 - 23 mg/dL '13 20 16  ' Creatinine 0.40 - 1.50 mg/dL 1.06 1.35(H) 1.08  Sodium 135 - 145 mEq/L 142 139 138  Potassium 3.5 - 5.1 mEq/L 3.5 3.9 4.0  Chloride 96 - 112 mEq/L 107 106 107  CO2 19 - 32 mEq/L '27 25 25  ' Calcium 8.4 - 10.5  mg/dL 8.7 8.6(L) 8.5(L)  Total Protein 6.0 - 8.3 g/dL 7.1 6.9 -  Total Bilirubin 0.2 - 1.2 mg/dL 0.9 1.3(H) -  Alkaline Phos 39 - 117 U/L 82 62 -  AST 0 - 37 U/L 23 22 -  ALT 0 - 53 U/L 32 26 -   CBC Latest Ref Rng & Units 07/28/2019 12/25/2018 02/26/2017  WBC 4.0 - 10.5 K/uL 7.3 9.5 9.3  Hemoglobin 13.0 - 17.0 g/dL 15.2 15.8 13.7  Hematocrit 39.0 - 52.0 % 45.7 48.3 39.7  Platelets 150.0 - 400.0 K/uL 192.0 175 202   Lipid Panel     Component Value Date/Time   CHOL 118 10/05/2018 0855   TRIG 97 10/05/2018 0855   HDL 44 10/05/2018 0855   LDLCALC 55 10/05/2018 0855   HEMOGLOBIN A1C No results found for: HGBA1C, MPG TSH No results for input(s): TSH in the last 8760 hours.  Cardiac Studies:   Thoracic CTA 09/15/2017: Stable aneurysmal disease of the ascending thoracic aorta with maximal diameter of 4.7 cm at the level of the sinuses of Valsalva and 4.2 cm in the tubular ascending thoracic aorta.Ascending thoracic aortic aneurysm. Recommend semi-annual imaging followup by CTA or MRA. No significant change from 08/10/2015 and 06/06/2014. Normal caliber abdominal aorta. No significant atherosclerotic plaque. Hepatic simple cysts. Cholelithiasis with mild gallbladder distention but no biliary dilatation. Colonic diverticular disease. Lumbar degenerative disc disease.  Echocardiogram 09/17/2018: Mildly depressed LV systolic function with EF 45%. Left ventricle cavity is normal in size. Moderate concentric hypertrophy of the left ventricle. Normal global wall motion. Unable to evaluate diastolic function due to A. Fibrillation. Left atrial cavity is severely dilated. Mild (Grade I) mitral regurgitation. Dilated ascending aorta measuring 4.7 cm. Simple liver cyst measuring 5.5 cm. No significant change compared to previous study on 03/10/2017.   Lexiscan Myoview Stress Test 10/04/2018: Resting EKG demonstrates atrial fibrillation with rapid ventricular response, leftward axis, incomplete right  bundle branch block, PVC.  Stress EKG is nondiagnostic for ischemia as a pharmacologic stress test. There is  a moderate-sized mild inferior wall ischemia extending from the base towards the apex in the distribution of right coronary artery.  Left ventricular systolic function calculated by QGS was 41% with inferior hypokinesis. High risk study.  Assessment     ICD-10-CM   1. Permanent atrial fibrillation (HCC)  I48.21 EKG 12-Lead    CANCELED: EKG 12-Lead    EKG 01/13/2019: Atrial fibrillation with controlled ventricular response at the rate of 84 bpm, normal axis.  Nonspecific T abnormality. No significant change from  EKG 09/01/18  Recommendations:   He is here on a three-month office visit and follow-up of angina pectoris and also descending thoracic aneurysm and permanent atrial fibrillation.  Blood pressure is well controlled, no change in his physical exam, since last office visit he has not used any sublingual nitroglycerin and he remained stable.  We again discussed regarding weight loss.  Advised him that we'll continue medical therapy for now for his abnormal stress test and mildly reduced LVEF and symptoms suggestive of angina pectoris which is improved on medical therapy and he is also tolerating statins well.  I'll repeat echocardiogram in 6 months and see him back at that time.  Adrian Prows, MD, [facility] 09/01/2019, 2:20 PM [facility]. [place] Pager: [phone] Office: [phone] If no answer Cell [phone]

## 2019-09-02 ENCOUNTER — Other Ambulatory Visit: Payer: Self-pay | Admitting: Cardiology

## 2019-09-02 DIAGNOSIS — E876 Hypokalemia: Secondary | ICD-10-CM

## 2019-09-02 LAB — BASIC METABOLIC PANEL
BUN/Creatinine Ratio: 10 (ref 10–24)
BUN: 11 mg/dL (ref 8–27)
CO2: 24 mmol/L (ref 20–29)
Calcium: 8.4 mg/dL — ABNORMAL LOW (ref 8.6–10.2)
Chloride: 101 mmol/L (ref 96–106)
Creatinine, Ser: 1.06 mg/dL (ref 0.76–1.27)
GFR calc Af Amer: 80 mL/min/{1.73_m2} (ref >59)
GFR calc non Af Amer: 69 mL/min/{1.73_m2} (ref >59)
Glucose: 92 mg/dL (ref 65–99)
Potassium: 3.3 mmol/L — ABNORMAL LOW (ref 3.5–5.2)
Sodium: 141 mmol/L (ref 134–144)

## 2019-09-02 LAB — TSH: TSH: 2.86 u[IU]/mL (ref 0.450–4.500)

## 2019-09-02 MED ORDER — POTASSIUM CHLORIDE CRYS ER 20 MEQ PO TBCR: 20.0000 meq | EXTENDED_RELEASE_TABLET | Freq: Every day | ORAL | 0 refills | Status: DC

## 2019-09-02 NOTE — Progress Notes (Signed)
Patient's potassium level is low.  About 1 month ago potassium was again very borderline low.  We will send in a prescription for 10 mEq of potassium supplements for 1 week.

## 2019-09-12 ENCOUNTER — Other Ambulatory Visit: Payer: Self-pay

## 2019-09-12 ENCOUNTER — Encounter: Payer: Medicare Other | Admitting: Cardiology

## 2019-09-12 DIAGNOSIS — Z006 Encounter for examination for normal comparison and control in clinical research program: Secondary | ICD-10-CM

## 2019-09-13 NOTE — Progress Notes (Signed)
ED Caucasian male with permanent atrial fibrillation, ascending aortic aneurysm, mild hyperlipidemia, angina pectoris with mildly abnormal nuclear stress test and mildly reduced LVEF echocardiogram in June 2020 research visit, patient being screened for [facility] LPA trial.  Patient has consented to the trial. If he is positive for screening, then will enroll.   Yates Decamp, MD, [facility] 09/13/2019, 12:15 PM [facility]. [place] Office: [phone]

## 2019-09-16 ENCOUNTER — Ambulatory Visit
Admission: RE | Admit: 2019-09-16 | Discharge: 2019-09-16 | Disposition: A | Discharge status: Home / Self Care | Payer: Medicare Other | Source: Ambulatory Visit | Attending: Cardiology | Admitting: Cardiology

## 2019-09-16 DIAGNOSIS — I712 Thoracic aortic aneurysm, without rupture, unspecified: Secondary | ICD-10-CM

## 2019-09-16 MED ORDER — IOPAMIDOL (ISOVUE-370) INJECTION 76%
75.0000 mL | Freq: Once | INTRAVENOUS | Status: AC | PRN
  Administered 2019-09-16: 75 mL via INTRAVENOUS

## 2019-09-18 NOTE — Progress Notes (Signed)
Stable aneurysmal dilatation of the aortic root and ascending thoracic aorta. The aortic root measures approximately 4.6 cm. The ascending thoracic aorta measures 4.1 cm.  New nodule noted in the lung probably of no clinical consequence as patient is a non-smoker.  I will repeat his CT scan in 1 year to follow-up on aortic aneurysm as well as nodule.Chronic cholecystitis probably present.

## 2019-09-18 NOTE — Addendum Note (Signed)
Addended by: Delrae Rend on: 09/18/2019 08:51 AM   Modules accepted: Orders

## 2019-10-05 ENCOUNTER — Ambulatory Visit: Payer: Medicare Other

## 2019-10-05 ENCOUNTER — Other Ambulatory Visit: Payer: Self-pay

## 2019-10-05 DIAGNOSIS — Z006 Encounter for examination for normal comparison and control in clinical research program: Secondary | ICD-10-CM

## 2019-10-12 ENCOUNTER — Other Ambulatory Visit: Payer: Self-pay | Admitting: Cardiology

## 2019-10-12 DIAGNOSIS — Z006 Encounter for examination for normal comparison and control in clinical research program: Secondary | ICD-10-CM

## 2019-12-05 ENCOUNTER — Other Ambulatory Visit: Payer: Self-pay | Admitting: Cardiology

## 2019-12-05 DIAGNOSIS — E785 Hyperlipidemia, unspecified: Secondary | ICD-10-CM

## 2020-08-21 ENCOUNTER — Other Ambulatory Visit: Payer: Self-pay | Admitting: Student

## 2020-08-21 DIAGNOSIS — M5416 Radiculopathy, lumbar region: Secondary | ICD-10-CM

## 2020-08-29 ENCOUNTER — Ambulatory Visit
Admission: RE | Admit: 2020-08-29 | Discharge: 2020-08-29 | Disposition: A | Discharge status: Home / Self Care | Payer: Medicare Other | Source: Ambulatory Visit | Attending: Student | Admitting: Student

## 2020-08-29 ENCOUNTER — Other Ambulatory Visit: Payer: Self-pay

## 2020-08-29 DIAGNOSIS — M5416 Radiculopathy, lumbar region: Secondary | ICD-10-CM

## 2020-08-30 ENCOUNTER — Other Ambulatory Visit (HOSPITAL_COMMUNITY): Payer: Self-pay | Admitting: Neurosurgery

## 2020-08-30 DIAGNOSIS — M48062 Spinal stenosis, lumbar region with neurogenic claudication: Secondary | ICD-10-CM

## 2020-08-31 ENCOUNTER — Other Ambulatory Visit: Payer: Self-pay

## 2020-08-31 ENCOUNTER — Ambulatory Visit (HOSPITAL_COMMUNITY)
Admission: RE | Admit: 2020-08-31 | Discharge: 2020-08-31 | Disposition: A | Discharge status: Home / Self Care | Payer: Medicare Other | Source: Ambulatory Visit | Attending: Neurosurgery | Admitting: Neurosurgery

## 2020-08-31 ENCOUNTER — Encounter: Payer: Self-pay | Admitting: Cardiology

## 2020-08-31 ENCOUNTER — Ambulatory Visit: Payer: Medicare Other | Admitting: Cardiology

## 2020-08-31 VITALS — BP 102/72 | HR 73 | Temp 97.7°F | Resp 16 | Ht 73.0 in | Wt 283.0 lb

## 2020-08-31 DIAGNOSIS — I712 Thoracic aortic aneurysm, without rupture, unspecified: Secondary | ICD-10-CM

## 2020-08-31 DIAGNOSIS — M48062 Spinal stenosis, lumbar region with neurogenic claudication: Secondary | ICD-10-CM | POA: Diagnosis not present

## 2020-08-31 DIAGNOSIS — I4821 Permanent atrial fibrillation: Secondary | ICD-10-CM

## 2020-08-31 DIAGNOSIS — E785 Hyperlipidemia, unspecified: Secondary | ICD-10-CM

## 2020-08-31 DIAGNOSIS — Z01810 Encounter for preprocedural cardiovascular examination: Secondary | ICD-10-CM

## 2020-08-31 NOTE — Progress Notes (Signed)
Primary Physician/Referring:  Javier Glazier, MD  Patient ID: Christopher Goodwin, male    DOB: 04-09-1945, 75 y.o.   MRN: [id number]  Chief Complaint  Patient presents with  . Hypertension  . Hyperlipidemia  . Follow-up   HPI:    Christopher Goodwin  is a 75 y.o. male  with permanent atrial fibrillation, ascending aortic aneurysm, mild hyperlipidemia and angina pectoris.   He moved here from [place], [place]. Has moderate thoracic aortic aneurysm at 4.7 cm that is stable by CT scan in 2019. Has been stable since 2016 and correlates well with echocardiogram. No Abdominal aortic aneurysm by CT in 2016 and 2017.   Due to exertional chest pain suggestive of angina pectoris he had undergone nuclear stress test on 10/04/2018 which had revealed inferior wall mild ischemia and EF 41%, echocardiogram and revealed EF 45% in June 2020.  Since being on statins and aggressive medical therapy, he has not had any recurrence of angina pectoris.  He now presents for 1 year follow-up of angina and also AAA, essentially asymptomatic.  He has not used any sublingual nitroglycerin.  Past Medical History:  Diagnosis Date  . Anticoagulated on Coumadin   . Arthritis   . BPH (benign prostatic hyperplasia)   . Cardiomyopathy, dilated, nonischemic ([place])    by 2008 cath and 2013 stress test results; as of 12/28/14: has been followed by Dr. Bernardo Heater in [place] (last seen 11/2014), moved to [facility] 12/2014  . Chronic atrial fibrillation [facility])    cardiologist-  dr Einar Gip---  per pt lov 10/ 2018  . Dilated aortic root (HCC)    4.9 cm by 05/26/14 echo [facility], Dr. Donnal Debar)  . Foley catheter in place   . Hyperlipidemia   . Permanent atrial fibrillation ([place]) 09/01/2018  . Thoracic ascending aortic aneurysm ([place])    per CT 08-10-2015  4.0cm  . Urinary retention   . Wears glasses    Past Surgical History:  Procedure Laterality Date  . CARDIAC CATHETERIZATION  04/10/2006  [place], [place]     [facility]): Mild plaquing w/o obstructive CAD, LVEF 40-45%, normal right heart pressures.   Marland Kitchen CARDIOVASCULAR STRESS TEST  01-09-2017   DR Einar Gip   Low risk myocardial perfusion study w/ no ischemia/  LV function 45% (but visually appears normal)  . CYSTOSCOPY N/A 01/30/2017   Procedure: CYSTOSCOPY;  Surgeon: Bjorn Loser, MD;  Location: [facility];  Service: Urology;  Laterality: N/A;  . LUMBAR LAMINECTOMY/DECOMPRESSION MICRODISCECTOMY Left 01/03/2015   Procedure: Lumbar four, lumbar five left Laminectomy and Foraminotomy ;  Surgeon: Kary Kos, MD;  Location: [facility];  Service: Neurosurgery;  Laterality: Left;  . TOTAL KNEE ARTHROPLASTY Bilateral 2007;  2003  . TRANSTHORACIC ECHOCARDIOGRAM  09-04-2016   DR Einar Gip   moderate LVH, ef 45%, mild generalized hypokinesis/ due to Afib unable to evaluate diastolic function/  mild LAE/  trivial MR & TR/  mild PR /  moderate dilated aortic root 4.8cm , mild atherosclerotic aorta/  IVC dilated with respiratory variation  . TRANSURETHRAL RESECTION OF PROSTATE N/A 01/30/2017   Procedure: TRANSURETHRAL RESECTION OF THE PROSTATE (TURP);  Surgeon: Bjorn Loser, MD;  Location: [facility];  Service: Urology;  Laterality: N/A;   Family History  Problem Relation Age of Onset  . Hypertension Mother   . Diabetes Brother     Social History   Tobacco Use  . Smoking status: Former Smoker    Years: 15.00  Types: Cigars    Quit date: 01/24/2015    Years since quitting: 5.6  . Smokeless tobacco: Never Used  Substance Use Topics  . Alcohol use: Yes    Alcohol/week: 2.0 standard drinks    Types: 2 Cans of beer per week    Comment: beer occ   Marital Status: Married  ROS  Review of Systems  Cardiovascular: Negative for dyspnea on exertion, leg swelling and syncope.  Respiratory: Negative for shortness of breath.   Gastrointestinal: Negative for melena.   Objective  Blood pressure 102/72, pulse  73, temperature 97.7 F (36.5 C), temperature source Temporal, resp. rate 16, height '6\' 1"'  (1.854 m), weight 283 lb (128.4 kg), SpO2 97 %.  Vitals with BMI 08/31/2020 09/01/2019 07/28/2019  Height '6\' 1"'  '6\' 1"'  '6\' 1"'   Weight 283 lbs 258 lbs 257 lbs 4 oz  BMI 37.35 88.82 80.03  Systolic 491 791 90  Diastolic 72 58 60  Pulse 73 65 56     Physical Exam Constitutional:      General: He is not in acute distress.    Appearance: He is well-developed.  Cardiovascular:     Rate and Rhythm: Normal rate. Rhythm irregularly irregular.     Pulses: Intact distal pulses.     Heart sounds: No murmur heard. No gallop.      Comments: No leg edema, no JVD.  Pulmonary:     Effort: Pulmonary effort is normal. No accessory muscle usage.     Breath sounds: Normal breath sounds.  Abdominal:     General: Bowel sounds are normal.     Palpations: Abdomen is soft.    Laboratory examination:   No results for input(s): NA, K, CL, CO2, GLUCOSE, BUN, CREATININE, CALCIUM, GFRNONAA, GFRAA in the last 8760 hours. CrCl cannot be calculated (Patient's most recent lab result is older than the maximum 21 days allowed.).  CMP Latest Ref Rng & Units 09/01/2019 07/28/2019 12/25/2018  Glucose 65 - 99 mg/dL 92 102(H) 103(H)  BUN 8 - 27 mg/dL '11 13 20  ' Creatinine 0.76 - 1.27 mg/dL 1.06 1.06 1.35(H)  Sodium 134 - 144 mmol/L 141 142 139  Potassium 3.5 - 5.2 mmol/L 3.3(L) 3.5 3.9  Chloride 96 - 106 mmol/L 101 107 106  CO2 20 - 29 mmol/L '24 27 25  ' Calcium 8.6 - 10.2 mg/dL 8.4(L) 8.7 8.6(L)  Total Protein 6.0 - 8.3 g/dL - 7.1 6.9  Total Bilirubin 0.2 - 1.2 mg/dL - 0.9 1.3(H)  Alkaline Phos 39 - 117 U/L - 82 62  AST 0 - 37 U/L - 23 22  ALT 0 - 53 U/L - 32 26   CBC Latest Ref Rng & Units 07/28/2019 12/25/2018 02/26/2017  WBC 4.0 - 10.5 K/uL 7.3 9.5 9.3  Hemoglobin 13.0 - 17.0 g/dL 15.2 15.8 13.7  Hematocrit 39.0 - 52.0 % 45.7 48.3 39.7  Platelets 150.0 - 400.0 K/uL 192.0 175 202   Lipid Panel     Component Value Date/Time    CHOL 118 10/05/2018 0855   TRIG 97 10/05/2018 0855   HDL 44 10/05/2018 0855   LDLCALC 55 10/05/2018 0855   HEMOGLOBIN A1C No results found for: HGBA1C, MPG TSH No results for input(s): TSH in the last 8760 hours.  External labs:   Labs 07/04/2020:  Total cholesterol 121, triglycerides 97, HDL 43, LDL 59.  Non-HDL cholesterol 78.  Sodium 139, potassium 4.2, BUN 20, creatinine 1.27, EGFR 59 mL.  CMP otherwise normal.  A1c 5.5%.  Vitamin D  32, vitamin B12 619.  Medications and allergies  No Known Allergies   Current Outpatient Medications  Medication Instructions  . atenolol (TENORMIN) 50 mg, Oral, Every evening  . atorvastatin (LIPITOR) 10 MG tablet TAKE ONE-HALF TABLET BY MOUTH ONCE DAILYAS DIRECTED  . cyanocobalamin (,VITAMIN B-12,) 1000 MCG/ML injection INJECT 1ML INTRAMUSCULARLY EVERY 30 DAYS  . folic acid (FOLVITE) 1 mg, Oral, Every evening  . nitrofurantoin (MACRODANTIN) 100 mg, Oral, 2 times daily  . nitrofurantoin, macrocrystal-monohydrate, (MACROBID) 100 MG capsule 1 capsule, Oral, Daily  . nitroGLYCERIN (NITROSTAT) 0.4 mg, Sublingual, Every 5 min PRN  . tamsulosin (FLOMAX) 0.4 mg, Oral, Every evening  . Vitamin D 2,000 Units, Oral, Every evening  . warfarin (COUMADIN) 6 mg, Oral, Every evening   Current Outpatient Medications  Medication Instructions  . atenolol (TENORMIN) 50 mg, Oral, Every evening  . atorvastatin (LIPITOR) 10 MG tablet TAKE ONE-HALF TABLET BY MOUTH ONCE DAILYAS DIRECTED  . cyanocobalamin (,VITAMIN B-12,) 1000 MCG/ML injection INJECT 1ML INTRAMUSCULARLY EVERY 30 DAYS  . folic acid (FOLVITE) 1 mg, Oral, Every evening  . nitrofurantoin (MACRODANTIN) 100 mg, Oral, 2 times daily  . nitrofurantoin, macrocrystal-monohydrate, (MACROBID) 100 MG capsule 1 capsule, Oral, Daily  . nitroGLYCERIN (NITROSTAT) 0.4 mg, Sublingual, Every 5 min PRN  . tamsulosin (FLOMAX) 0.4 mg, Oral, Every evening  . Vitamin D 2,000 Units, Oral, Every evening  . warfarin  (COUMADIN) 6 mg, Oral, Every evening    Radiology:   CTA abdomen 08/10/2015: Aorta: Normal caliber abdominal aorta. No significant atherosclerotic vascular calcifications. No evidence of dissection or other acute abnormality.  CT angio chest for follow-up of thoracic aortic aneurysm 09/16/2019: 1. Stable aneurysmal dilatation of the aortic root and ascending thoracic aorta. The aortic root measures approximately 4.6 cm. The ascending thoracic aorta measures 4.1 cm.  2. New nodular density in the left lower lobe measuring up to 6 mm. Non-contrast chest CT at 3-6 months is recommended. If the nodules are stable at time of repeat CT, then future CT at 18-24 months (from today's scan) is considered optional for low-risk patients, but is recommended for high-risk patients 3. 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 4. Large gallstone at the base of the gallbladder with evidence of chronic gallbladder distension. Gallbladder is not completely evaluated on this examination. 5. Hepatic cysts. No significant change from 06/06/2014, 08/10/2015 and 09/15/2017.  Cardiac Studies:   Lexiscan Myoview Stress Test 10/04/2018: Resting EKG demonstrates atrial fibrillation with rapid ventricular response, leftward axis, incomplete right bundle branch block, PVC.  Stress EKG is nondiagnostic for ischemia as a pharmacologic stress test. There is a moderate-sized mild inferior wall ischemia extending from the base towards the apex in the distribution of right coronary artery.  Left ventricular systolic function calculated by QGS was 41% with inferior hypokinesis. High risk study.  Echocardiogram 07/14/2019:  Left ventricle cavity is normal in size. Mild concentric hypertrophy of the left ventricle. Abnormal septal wall motion due to left bundle branch block. Mildly depressed LV systolic function with visual EF 45-50%. Unable to evaluate diastolic function due to atrial  fibrillation.  Left atrial cavity is severely dilated.  Trileaflet aortic valve. Trace aortic regurgitation. Mild (Grade I) mitral regurgitation.  Ascending aorta aneurysm, proximal aorta measuring 4.9 cm. Previous measurement in 09/17/2018 was 4.7 cm.  Incidental finding of 5 cm hypoechoic liver cyst.   Exercise treadmill stress test 10/05/2019:  Exercise treadmill stress test performed using Bruce protocol. Patient reached 8.5  METS, and 129% of age predicted maximum heart rate. Exercise capacity was low. No chest pain reported. Normal heart rate and blood pressure response. Stress ECG demonstrated atrial fibrillation with rapid ventricular response, frequent PVC's, no ST-T wave abnormalities.  Low risk study.    EKG  EKG 08/31/2020: Atrial fibrillation with controlled ventricular response at a rate of 71 bpm, left axis deviation, left anterior fascicular block.  Poor R wave progression, cannot exclude anteroseptal infarct old.  Nonspecific T abnormality.  Single PVC.  No significant change from 01/13/2019.  Assessment     ICD-10-CM   1. Thoracic aortic aneurysm without rupture (HCC)  I71.2 EKG 12-Lead    PCV ECHOCARDIOGRAM COMPLETE  2. Permanent atrial fibrillation ([place]). CHA2DS2-VASc Score is 3.  Yearly risk of stroke: 3.2% (A, Vasc Dz).  I48.21 PCV ECHOCARDIOGRAM COMPLETE  3. Mild hyperlipidemia  E78.5   4. Preop cardiovascular exam  Z01.810      No orders of the defined types were placed in this encounter.   Medications Discontinued During This Encounter  Medication Reason  . potassium chloride SA (KLOR-CON) 20 MEQ tablet Error    Recommendations:   Christopher Goodwin  is a 75 y.o. male  with permanent atrial fibrillation, ascending aortic aneurysm, mild hyperlipidemia and stable angina pectoris with a nuclear stress test revealing mild inferior ischemia in August 2020. On aggressive medical therapy has not had recurrence of angina.  He presents for 1 year follow up, denies any  new symptoms, tolerating all his medications well.  I reviewed the results of the last CT scan, I will repeat Echocardiogram (Alternate years of CT and Echo as AAA has been stable over past 5 years) of the chest to evaluate thoracic aortic aneurysm/aortic root dilataiton.  With regard to atrial fibrillation, remains rate controlled on single agent.  Blood pressure is also normal.  He is on appropriate medical therapy and lipids are under excellent control as well.  I will see him back on annual basis. External labs reviewed and renal function is remained stable lipids are well controlled.  Due to spinal stenosis, he has been scheduled for steroid injection, he may need repeat surgery, from cardiac standpoint he can be taken up for the surgery with low risk, letter will be sent to Dr. Kary Kos.  Patient is aware when to hold his anticoagulation, being managed by his PCP. ChadsVasc risk is low (3 now that is 75 years of age).    Adrian Prows, MD, [facility] 09/02/2020, 9:52 AM Office: [phone] Fax: [phone] Pager: [phone]

## 2020-09-02 ENCOUNTER — Encounter: Payer: Self-pay | Admitting: Cardiology

## 2020-09-10 ENCOUNTER — Other Ambulatory Visit: Payer: Self-pay | Admitting: Neurosurgery

## 2020-09-10 DIAGNOSIS — G950 Syringomyelia and syringobulbia: Secondary | ICD-10-CM

## 2020-09-12 ENCOUNTER — Ambulatory Visit: Payer: Medicare Other

## 2020-09-12 ENCOUNTER — Other Ambulatory Visit: Payer: Self-pay

## 2020-09-12 DIAGNOSIS — I4821 Permanent atrial fibrillation: Secondary | ICD-10-CM

## 2020-09-12 DIAGNOSIS — I712 Thoracic aortic aneurysm, without rupture, unspecified: Secondary | ICD-10-CM

## 2020-09-16 NOTE — Progress Notes (Signed)
Echocardiogram 09/12/2020: Left ventricle cavity is normal in size and wall thickness. Normal global wall motion. Normal LV systolic function with EF 55%. Indeterminate diastolic filling pattern. The aortic root is mildly dilated. Prox ascending aorta mea, trace AI, mild MR. ures 4.3 cm. No significant valvular abnormality. No evidence of pulmonary hypertension.Previous study in 07/2019 noted severe LA dilatation, prox ascending aorta diameter 4.9 cm.

## 2020-09-23 ENCOUNTER — Other Ambulatory Visit: Payer: Self-pay

## 2020-09-23 ENCOUNTER — Ambulatory Visit
Admission: RE | Admit: 2020-09-23 | Discharge: 2020-09-23 | Disposition: A | Discharge status: Home / Self Care | Payer: Medicare Other | Source: Ambulatory Visit | Attending: Neurosurgery | Admitting: Neurosurgery

## 2020-09-23 DIAGNOSIS — G950 Syringomyelia and syringobulbia: Secondary | ICD-10-CM

## 2020-10-02 ENCOUNTER — Other Ambulatory Visit: Payer: Self-pay | Admitting: Neurosurgery

## 2020-10-03 ENCOUNTER — Encounter: Payer: Self-pay | Admitting: Cardiology

## 2020-10-29 NOTE — Progress Notes (Signed)
Surgical Instructions    Your procedure is scheduled on Friday July 29.  Report to [facility] Main Entrance "A" at 5:30 A.M., then check in with the Admitting office.  Call this number if you have problems the morning of surgery:  [phone]   If you have any questions prior to your surgery date call [phone]: Open Monday-Friday 8am-4pm    Remember:  Do not eat or drink after midnight the night before your surgery.     Take these medicines the morning of surgery with A SIP OF WATER :              atorvastatin (LIPITOR)              nitrofurantoin (MACRODANTIN)               nitroGLYCERIN (NITROSTAT) if needed   As of today, STOP taking any Aspirin (unless otherwise instructed by your surgeon) Aleve, Naproxen, Ibuprofen, Motrin, Advil, Goody's, BC's, all herbal medications, fish oil, and all vitamins.          Do not wear jewelry or makeup Do not wear lotions, powders, perfumes/colognes, or deodorant. Do not shave 48 hours prior to surgery.  Men may shave face and neck. Do not bring valuables to the hospital. DO Not wear nail polish, gel polish, artificial nails, or any other type of covering on  natural nails including finger and toenails. If patients have artificial nails, gel coating, etc. that need to be removed by a nail salon please have this removed prior to surgery or surgery may need to be canceled/delayed if the surgeon/ anesthesia feels like the patient is unable to be adequately monitored.             [place] is not responsible for any belongings or valuables.  Do NOT Smoke (Tobacco/Vaping) or drink Alcohol 24 hours prior to your procedure If you use a CPAP at night, you may bring all equipment for your overnight stay.   Contacts, glasses, dentures or bridgework may not be worn into surgery, please bring cases for these belongings   For patients admitted to the hospital, discharge time will be determined by your treatment team.   Patients discharged the day  of surgery will not be allowed to drive home, and someone needs to stay with them for 24 hours.  ONLY 1 SUPPORT PERSON MAY BE PRESENT WHILE YOU ARE IN SURGERY. IF YOU ARE TO BE ADMITTED ONCE YOU ARE IN YOUR ROOM YOU WILL BE ALLOWED TWO (2) VISITORS.  Minor children may have two parents present. Special consideration for safety and communication needs will be reviewed on a case by case basis.  Special instructions:    Oral Hygiene is also important to reduce your risk of infection.  Remember - BRUSH YOUR TEETH THE MORNING OF SURGERY WITH YOUR REGULAR TOOTHPASTE   [place]- Preparing For Surgery  Before surgery, you can play an important role. Because skin is not sterile, your skin needs to be as free of germs as possible. You can reduce the number of germs on your skin by washing with CHG (chlorahexidine gluconate) Soap before surgery.  CHG is an antiseptic cleaner which kills germs and bonds with the skin to continue killing germs even after washing.     Please do not use if you have an allergy to CHG or antibacterial soaps. If your skin becomes reddened/irritated stop using the CHG.  Do not shave (including legs and underarms) for at least 48 hours  prior to first CHG shower. It is OK to shave your face.  Please follow these instructions carefully.     Shower the NIGHT BEFORE SURGERY and the MORNING OF SURGERY with CHG Soap.   If you chose to wash your hair, wash your hair first as usual with your normal shampoo. After you shampoo, rinse your hair and body thoroughly to remove the shampoo.  Then [employer] and genitals (private parts) with your normal soap and rinse thoroughly to remove soap.  After that Use CHG Soap as you would any other liquid soap. You can apply CHG directly to the skin and wash gently with a scrungie or a clean washcloth.   Apply the CHG Soap to your body ONLY FROM THE NECK DOWN.  Do not use on open wounds or open sores. Avoid contact with your eyes, ears, mouth and  genitals (private parts). Wash Face and genitals (private parts)  with your normal soap.   Wash thoroughly, paying special attention to the area where your surgery will be performed.  Thoroughly rinse your body with warm water from the neck down.  DO NOT shower/wash with your normal soap after using and rinsing off the CHG Soap.  Pat yourself dry with a CLEAN TOWEL.  Wear CLEAN PAJAMAS to bed the night before surgery  Place CLEAN SHEETS on your bed the night before your surgery  DO NOT SLEEP WITH PETS.   Day of Surgery:  Take a shower with CHG soap. Wear Clean/Comfortable clothing the morning of surgery Do not apply any deodorants/lotions.   Remember to brush your teeth WITH YOUR REGULAR TOOTHPASTE.   Please read over the following fact sheets that you were given.

## 2020-10-30 ENCOUNTER — Other Ambulatory Visit: Payer: Self-pay

## 2020-10-30 ENCOUNTER — Encounter (HOSPITAL_COMMUNITY): Payer: Self-pay

## 2020-10-30 ENCOUNTER — Encounter (HOSPITAL_COMMUNITY)
Admission: RE | Admit: 2020-10-30 | Discharge: 2020-10-30 | Disposition: A | Discharge status: Home / Self Care | Payer: Medicare Other | Source: Ambulatory Visit | Attending: Neurosurgery | Admitting: Neurosurgery

## 2020-10-30 DIAGNOSIS — Z20822 Contact with and (suspected) exposure to covid-19: Secondary | ICD-10-CM | POA: Insufficient documentation

## 2020-10-30 DIAGNOSIS — Z01812 Encounter for preprocedural laboratory examination: Secondary | ICD-10-CM | POA: Insufficient documentation

## 2020-10-30 HISTORY — DX: Other specified postprocedural states: Z98.890

## 2020-10-30 HISTORY — DX: Other specified postprocedural states: R11.2

## 2020-10-30 LAB — BASIC METABOLIC PANEL
Anion gap: 6 (ref 5–15)
BUN: 17 mg/dL (ref 8–23)
CO2: 26 mmol/L (ref 22–32)
Calcium: 8.9 mg/dL (ref 8.9–10.3)
Chloride: 104 mmol/L (ref 98–111)
Creatinine, Ser: 1.22 mg/dL (ref 0.61–1.24)
GFR, Estimated: >60 mL/min (ref >60)
Glucose, Bld: 101 mg/dL — ABNORMAL HIGH (ref 70–99)
Potassium: 4.3 mmol/L (ref 3.5–5.1)
Sodium: 136 mmol/L (ref 135–145)

## 2020-10-30 LAB — CBC
HCT: 51.9 % (ref 39.0–52.0)
Hemoglobin: 17.2 g/dL — ABNORMAL HIGH (ref 13.0–17.0)
MCH: 30.6 pg (ref 26.0–34.0)
MCHC: 33.1 g/dL (ref 30.0–36.0)
MCV: 92.2 fL (ref 80.0–100.0)
Platelets: 204 10*3/uL (ref 150–400)
RBC: 5.63 MIL/uL (ref 4.22–5.81)
RDW: 12.9 % (ref 11.5–15.5)
WBC: 7 10*3/uL (ref 4.0–10.5)
nRBC: 0 % (ref 0.0–0.2)

## 2020-10-30 LAB — TYPE AND SCREEN
ABO/RH(D): O POS
Antibody Screen: NEGATIVE

## 2020-10-30 LAB — SURGICAL PCR SCREEN
MRSA, PCR: NEGATIVE
Staphylococcus aureus: NEGATIVE

## 2020-10-30 LAB — SARS CORONAVIRUS 2 (TAT 6-24 HRS): SARS Coronavirus 2: NEGATIVE

## 2020-10-30 NOTE — Progress Notes (Addendum)
PCP - Bill Salinas, NP Cardiologist - Yates Decamp, MD  PPM/ICD - denies Device Orders - N/A Rep Notified - N/A  Chest x-ray - N/A EKG - 08/31/2020 Stress Test - 10/12/2019 ECHO - 09/12/2020 Cardiac Cath - 04/10/2006  Sleep Study - more than 10 years  CPAP - N/A  Fasting Blood Sugar - N/A  Blood Thinner Instructions: Coumadin - last dose - 10/26/2020  Aspirin Instructions: Patient was instructed: As of today, STOP taking any Aspirin (unless otherwise instructed by your surgeon) Aleve, Naproxen, Ibuprofen, Motrin, Advil, Goody's, BC's, all herbal medications, fish oil, and all vitamins.  ERAS Protcol - No PRE-SURGERY Ensure or G2- N/A  COVID TEST- 10/30/2020   Anesthesia review: yes; cardiac history. Hgb 17.2 in PAT. BP in PAT 88/75 - patient verbalized that his SBP is usually low (high 80s - low 90s). No acute distress noted at this time, no complaints.   Patient denies shortness of breath, fever, cough and chest pain at PAT appointment   All instructions explained to the patient, with a verbal understanding of the material. Patient agrees to go over the instructions while at home for a better understanding. Patient also instructed to self quarantine after being tested for COVID-19. The opportunity to ask questions was provided.

## 2020-10-30 NOTE — Progress Notes (Signed)
Surgical Instructions    Your procedure is scheduled on Friday July 29.   Report to [facility] Main Entrance "A" at 5:30 A.M., then check in with the Admitting office.  Call this number if you have problems the morning of surgery:  [phone]   If you have any questions prior to your surgery date call [phone]: Open Monday-Friday 8am-4pm    Remember:  Do not eat or drink after midnight the night before your surgery.     Take these medicines the morning of surgery with A SIP OF WATER :              atorvastatin (LIPITOR)              nitrofurantoin (MACRODANTIN)               nitroGLYCERIN (NITROSTAT) if needed  Follow your surgeon's instructions on when to stop warfarin (COUMADIN).  If no instructions were given by your surgeon then you will need to call the office to get those instructions.     As of today, STOP taking any Aspirin (unless otherwise instructed by your surgeon) Aleve, Naproxen, Ibuprofen, Motrin, Advil, Goody's, BC's, all herbal medications, fish oil, and all vitamins.          Do not wear jewelry  Do not wear lotions, powders, colognes, or deodorant. Men may shave face and neck. Do not bring valuables to the hospital. DO Not wear nail polish, gel polish, artificial nails, or any other type of covering on natural nails including finger and toenails. If patients have artificial nails, gel coating, etc. that need to be removed by a nail salon please have this removed prior to surgery or surgery may need to be canceled/delayed if the surgeon/ anesthesia feels like the patient is unable to be adequately monitored.             [place] [facility] is not responsible for any belongings or valuables.  Do NOT Smoke (Tobacco/Vaping) or drink Alcohol 24 hours prior to your procedure If you use a CPAP at night, you may bring all equipment for your overnight stay.   Contacts, glasses, dentures or bridgework may not be worn into surgery, please bring cases for these belongings    For patients admitted to the hospital, discharge time will be determined by your treatment team.   Patients discharged the day of surgery will not be allowed to drive home, and someone needs to stay with them for 24 hours.  ONLY 1 SUPPORT PERSON MAY BE PRESENT WHILE YOU ARE IN SURGERY. IF YOU ARE TO BE ADMITTED ONCE YOU ARE IN YOUR ROOM YOU WILL BE ALLOWED TWO (2) VISITORS.  Minor children may have two parents present. Special consideration for safety and communication needs will be reviewed on a case by case basis.  Special instructions:    Oral Hygiene is also important to reduce your risk of infection.  Remember - BRUSH YOUR TEETH THE MORNING OF SURGERY WITH YOUR REGULAR TOOTHPASTE   - Preparing For Surgery  Before surgery, you can play an important role. Because skin is not sterile, your skin needs to be as free of germs as possible. You can reduce the number of germs on your skin by washing with CHG (chlorahexidine gluconate) Soap before surgery.  CHG is an antiseptic cleaner which kills germs and bonds with the skin to continue killing germs even after washing.     Please do not use if you have an allergy to CHG or  antibacterial soaps. If your skin becomes reddened/irritated stop using the CHG.  Do not shave (including legs and underarms) for at least 48 hours prior to first CHG shower. It is OK to shave your face.  Please follow these instructions carefully.     Shower the NIGHT BEFORE SURGERY and the MORNING OF SURGERY with CHG Soap.   If you chose to wash your hair, wash your hair first as usual with your normal shampoo. After you shampoo, rinse your hair and body thoroughly to remove the shampoo.  Then [employer] and genitals (private parts) with your normal soap and rinse thoroughly to remove soap.  After that Use CHG Soap as you would any other liquid soap. You can apply CHG directly to the skin and wash gently with a scrungie or a clean washcloth.   Apply the CHG  Soap to your body ONLY FROM THE NECK DOWN.  Do not use on open wounds or open sores. Avoid contact with your eyes, ears, mouth and genitals (private parts). Wash Face and genitals (private parts)  with your normal soap.   Wash thoroughly, paying special attention to the area where your surgery will be performed.  Thoroughly rinse your body with warm water from the neck down.  DO NOT shower/wash with your normal soap after using and rinsing off the CHG Soap.  Pat yourself dry with a CLEAN TOWEL.  Wear CLEAN PAJAMAS to bed the night before surgery  Place CLEAN SHEETS on your bed the night before your surgery  DO NOT SLEEP WITH PETS.   Day of Surgery:  Take a shower with CHG soap. Wear Clean/Comfortable clothing the morning of surgery Do not apply any deodorants/lotions.   Remember to brush your teeth WITH YOUR REGULAR TOOTHPASTE.   Please read over the following fact sheets that you were given.

## 2020-10-30 NOTE — Progress Notes (Signed)
Abnormal lab in PAT - Hgb 17.2. Updated Christopher Goodwin at Dr. Wynetta Emery office.

## 2020-10-31 NOTE — Anesthesia Preprocedure Evaluation (Addendum)
Anesthesia Evaluation  Patient identified by MRN, date of birth, ID band Patient awake    Reviewed: Allergy & Precautions, NPO status , Patient's Chart, lab work & pertinent test results  History of Anesthesia Complications (+) PONV and history of anesthetic complications  Airway Mallampati: III  TM Distance: >3 FB Neck ROM: Full   Comment: Large neck circumference Dental no notable dental hx.    Pulmonary former smoker,    Pulmonary exam normal breath sounds clear to auscultation       Cardiovascular Exercise Tolerance: Poor + dysrhythmias (on coumadin) Atrial Fibrillation  Rhythm:Irregular Rate:Normal  Thoracic aneurysm   Neuro/Psych negative psych ROS   GI/Hepatic negative GI ROS, Neg liver ROS,   Endo/Other  Morbid obesity  Renal/GU  Bladder dysfunction      Musculoskeletal  (+) Arthritis ,   Abdominal (+) + obese,   Peds  Hematology   Anesthesia Other Findings   Reproductive/Obstetrics                           Anesthesia Physical Anesthesia Plan  ASA: 3  Anesthesia Plan: General   Post-op Pain Management:    Induction: Intravenous  PONV Risk Score and Plan: 2  Airway Management Planned: Oral ETT and Video Laryngoscope Planned  Additional Equipment: Arterial line  Intra-op Plan:   Post-operative Plan: Extubation in OR  Informed Consent: I have reviewed the patients History and Physical, chart, labs and discussed the procedure including the risks, benefits and alternatives for the proposed anesthesia with the patient or authorized representative who has indicated his/her understanding and acceptance.     Dental advisory given  Plan Discussed with: CRNA, Anesthesiologist and Surgeon  Anesthesia Plan Comments: (GETA. + arterial line vs. Clear sight   PAT note by Antionette Poles, PA-C: Follows with cardiologist Dr. Jacinto Halim for hx of permanent atrial fibrillation,  ascending aortic aneurysm, mild hyperlipidemia and stable angina pectoris with a nuclear stress test revealing mild inferior ischemia in August 2020. Subsequent treadmill stress test July 2021 was low risk. On aggressive medical therapy has not had recurrence of angina. Last seen 08/31/20. Preop clearance was addressed in note, "Due to spinal stenosis, he has been scheduled for steroid injection, he may need repeat surgery, from cardiac standpoint he can be taken up for the surgery with low risk, letter will be sent to Dr. Lysbeth Penner. Patient is aware when to hold his anticoagulation, being managed by his PCP. ChadsVasc risk is low (3 now that is 75 years of age)."  Pt reported LD coumadin 10/26/20.  Preop labs reviewed, unremarkable.  EKG 08/31/2020: Atrial fibrillation with controlled ventricular response at a rate of 71 bpm, left axis deviation, left anterior fascicular block.  Poor R wave progression, cannot exclude anteroseptal infarct old.  Nonspecific T abnormality.  Single PVC.  No significant change from 01/13/2019.  Echocardiogram 09/12/2020:  Left ventricle cavity is normal in size and wall thickness. Normal global  wall motion. Normal LV systolic function with EF 55%. Indeterminate  diastolic filling pattern.  The aortic root is mildly dilated. Prox ascending aorta mea, trace AI,  mild MR. ures 4.3 cm.  No significant valvular abnormality.  No evidence of pulmonary hypertension.Previous study in 07/2019 noted  severe LA dilatation, prox ascending aorta diameter 4.9 cm.  Exercise treadmill stress test 10/05/2019:  Exercise treadmill stress test performed using Bruce protocol. Patient reached 8.5 METS, and 129% of age predicted maximum heart rate. Exercise capacity was low. No  chest pain reported. Normal heart rate and blood pressure response. Stress ECG demonstrated atrial fibrillation with rapid ventricular response, frequent PVC's, no ST-T wave abnormalities.  Low risk  study.  Lexiscan Myoview Stress Test06/29/2020: Resting EKG demonstrates atrial fibrillation with rapid ventricular response, leftward axis, incomplete right bundle branch block, PVC. Stress EKG is nondiagnostic for ischemia as a pharmacologic stress test. There is a moderate-sized mild inferior wall ischemia extending from the base towards the apex in the distribution of right coronary artery. Left ventricular systolic function calculated by QGS was 41% with inferior hypokinesis. High risk study. )      Anesthesia Quick Evaluation

## 2020-10-31 NOTE — Progress Notes (Signed)
Anesthesia Chart Review:  Follows with cardiologist Dr. Jacinto Halim for hx of permanent atrial fibrillation, ascending aortic aneurysm, mild hyperlipidemia and stable angina pectoris with a nuclear stress test revealing mild inferior ischemia in August 2020. Subsequent treadmill stress test July 2021 was low risk. On aggressive medical therapy has not had recurrence of angina. Last seen 08/31/20. Preop clearance was addressed in note, "Due to spinal stenosis, he has been scheduled for steroid injection, he may need repeat surgery, from cardiac standpoint he can be taken up for the surgery with low risk, letter will be sent to Dr. Donalee Citrin.  Patient is aware when to hold his anticoagulation, being managed by his PCP. ChadsVasc risk is low (3 now that is 75 years of age)."  Pt reported LD coumadin 10/26/20.  Preop labs reviewed, unremarkable.  EKG 08/31/2020: Atrial fibrillation with controlled ventricular response at a rate of 71 bpm, left axis deviation, left anterior fascicular block.  Poor R wave progression, cannot exclude anteroseptal infarct old.  Nonspecific T abnormality.  Single PVC.  No significant change from 01/13/2019.  Echocardiogram 09/12/2020:  Left ventricle cavity is normal in size and wall thickness. Normal global  wall motion. Normal LV systolic function with EF 55%. Indeterminate  diastolic filling pattern.  The aortic root is mildly dilated. Prox ascending aorta mea, trace AI,  mild MR. ures 4.3 cm.  No significant valvular abnormality.  No evidence of pulmonary hypertension.Previous study in 07/2019 noted  severe LA dilatation, prox ascending aorta diameter 4.9 cm.  Exercise treadmill stress test 10/05/2019:  Exercise treadmill stress test performed using Bruce protocol.  Patient reached 8.5 METS, and 129% of age predicted maximum heart rate.  Exercise capacity was low.  No chest pain reported.  Normal heart rate and blood pressure response. Stress ECG demonstrated atrial fibrillation  with rapid ventricular response, frequent PVC's, no ST-T wave abnormalities.  Low risk study.   Lexiscan Myoview Stress Test 10/04/2018: Resting EKG demonstrates atrial fibrillation with rapid ventricular response, leftward axis, incomplete right bundle branch block, PVC.  Stress EKG is nondiagnostic for ischemia as a pharmacologic stress test. There is a moderate-sized mild inferior wall ischemia extending from the base towards the apex in the distribution of right coronary artery.  Left ventricular systolic function calculated by QGS was 41% with inferior hypokinesis. High risk study.   Zannie Cove [facility] Short Stay Center/Anesthesiology Phone [phone] 10/31/2020 1:43 PM

## 2020-11-02 ENCOUNTER — Encounter (HOSPITAL_COMMUNITY): Payer: Self-pay | Admitting: Neurosurgery

## 2020-11-02 ENCOUNTER — Other Ambulatory Visit: Payer: Self-pay

## 2020-11-02 ENCOUNTER — Inpatient Hospital Stay (HOSPITAL_COMMUNITY): Payer: Medicare Other

## 2020-11-02 ENCOUNTER — Inpatient Hospital Stay (HOSPITAL_COMMUNITY): Payer: Medicare Other | Admitting: Physician Assistant

## 2020-11-02 ENCOUNTER — Inpatient Hospital Stay (HOSPITAL_COMMUNITY)
Admission: RE | Admit: 2020-11-02 | Discharge: 2020-11-03 | DRG: 454 | Disposition: A | Discharge status: Home / Self Care | Payer: Medicare Other | Attending: Neurosurgery | Admitting: Neurosurgery

## 2020-11-02 ENCOUNTER — Inpatient Hospital Stay (HOSPITAL_COMMUNITY)
Admission: RE | Disposition: A | Discharge status: Home / Self Care | Payer: Self-pay | Source: Home / Self Care | Attending: Neurosurgery

## 2020-11-02 ENCOUNTER — Inpatient Hospital Stay (HOSPITAL_COMMUNITY): Payer: Medicare Other | Admitting: Anesthesiology

## 2020-11-02 DIAGNOSIS — M4722 Other spondylosis with radiculopathy, cervical region: Secondary | ICD-10-CM | POA: Diagnosis present

## 2020-11-02 DIAGNOSIS — M2578 Osteophyte, vertebrae: Secondary | ICD-10-CM | POA: Diagnosis present

## 2020-11-02 DIAGNOSIS — E785 Hyperlipidemia, unspecified: Secondary | ICD-10-CM | POA: Diagnosis present

## 2020-11-02 DIAGNOSIS — M1990 Unspecified osteoarthritis, unspecified site: Secondary | ICD-10-CM | POA: Diagnosis present

## 2020-11-02 DIAGNOSIS — I420 Dilated cardiomyopathy: Secondary | ICD-10-CM | POA: Diagnosis present

## 2020-11-02 DIAGNOSIS — I4821 Permanent atrial fibrillation: Secondary | ICD-10-CM | POA: Diagnosis present

## 2020-11-02 DIAGNOSIS — M4712 Other spondylosis with myelopathy, cervical region: Secondary | ICD-10-CM | POA: Diagnosis present

## 2020-11-02 DIAGNOSIS — Z419 Encounter for procedure for purposes other than remedying health state, unspecified: Secondary | ICD-10-CM

## 2020-11-02 DIAGNOSIS — M4802 Spinal stenosis, cervical region: Principal | ICD-10-CM | POA: Diagnosis present

## 2020-11-02 DIAGNOSIS — N400 Enlarged prostate without lower urinary tract symptoms: Secondary | ICD-10-CM | POA: Diagnosis present

## 2020-11-02 DIAGNOSIS — I712 Thoracic aortic aneurysm, without rupture: Secondary | ICD-10-CM | POA: Diagnosis present

## 2020-11-02 DIAGNOSIS — Z8249 Family history of ischemic heart disease and other diseases of the circulatory system: Secondary | ICD-10-CM

## 2020-11-02 DIAGNOSIS — Z20822 Contact with and (suspected) exposure to covid-19: Secondary | ICD-10-CM | POA: Diagnosis present

## 2020-11-02 DIAGNOSIS — Z87891 Personal history of nicotine dependence: Secondary | ICD-10-CM

## 2020-11-02 DIAGNOSIS — Z7901 Long term (current) use of anticoagulants: Secondary | ICD-10-CM | POA: Diagnosis not present

## 2020-11-02 HISTORY — PX: ANTERIOR CERVICAL DECOMPRESSION/DISCECTOMY FUSION 4 LEVELS: SHX5556

## 2020-11-02 LAB — PROTIME-INR
INR: 1.1 (ref 0.8–1.2)
Prothrombin Time: 14.3 seconds (ref 11.4–15.2)

## 2020-11-02 LAB — APTT: aPTT: 29 s (ref 24–36)

## 2020-11-02 SURGERY — ANTERIOR CERVICAL DECOMPRESSION/DISCECTOMY FUSION 4 LEVELS
Anesthesia: General

## 2020-11-02 MED ORDER — ALUM & MAG HYDROXIDE-SIMETH 200-200-20 MG/5ML PO SUSP: 30.0000 mL | Freq: Four times a day (QID) | ORAL | Status: DC | PRN

## 2020-11-02 MED ORDER — EPHEDRINE 5 MG/ML INJ
INTRAVENOUS | Status: AC
  Filled 2020-11-02: qty 5

## 2020-11-02 MED ORDER — ASCORBIC ACID 500 MG PO TABS
500.0000 mg | ORAL_TABLET | Freq: Every day | ORAL | Status: DC
  Administered 2020-11-02: 500 mg via ORAL
  Filled 2020-11-02: qty 1

## 2020-11-02 MED ORDER — DROPERIDOL 2.5 MG/ML IJ SOLN: 0.6250 mg | Freq: Once | INTRAMUSCULAR | Status: DC | PRN

## 2020-11-02 MED ORDER — THROMBIN 20000 UNITS EX SOLR
CUTANEOUS | Status: DC | PRN
  Administered 2020-11-02: 20 mL via TOPICAL

## 2020-11-02 MED ORDER — CHLORHEXIDINE GLUCONATE CLOTH 2 % EX PADS: 6.0000 | MEDICATED_PAD | Freq: Once | CUTANEOUS | Status: DC

## 2020-11-02 MED ORDER — THROMBIN 20000 UNITS EX SOLR
CUTANEOUS | Status: AC
  Filled 2020-11-02: qty 20000

## 2020-11-02 MED ORDER — ACETAMINOPHEN 500 MG PO TABS
1000.0000 mg | ORAL_TABLET | Freq: Once | ORAL | Status: AC
  Administered 2020-11-02: 1000 mg via ORAL
  Filled 2020-11-02: qty 2

## 2020-11-02 MED ORDER — FENTANYL CITRATE (PF) 250 MCG/5ML IJ SOLN
INTRAMUSCULAR | Status: DC | PRN
  Administered 2020-11-02: 150 ug via INTRAVENOUS
  Administered 2020-11-02: 100 ug via INTRAVENOUS

## 2020-11-02 MED ORDER — OXYCODONE HCL 5 MG PO TABS
5.0000 mg | ORAL_TABLET | Freq: Once | ORAL | Status: AC | PRN
  Administered 2020-11-02: 5 mg via ORAL

## 2020-11-02 MED ORDER — OXYCODONE HCL 5 MG PO TABS
10.0000 mg | ORAL_TABLET | ORAL | Status: DC | PRN
Start: 2020-11-02 — End: 2020-11-02
  Filled 2020-11-02: qty 2

## 2020-11-02 MED ORDER — CEFAZOLIN SODIUM-DEXTROSE 2-4 GM/100ML-% IV SOLN
2.0000 g | Freq: Three times a day (TID) | INTRAVENOUS | Status: AC
  Administered 2020-11-02 – 2020-11-03 (×2): 2 g via INTRAVENOUS
  Filled 2020-11-02 (×2): qty 100

## 2020-11-02 MED ORDER — ROCURONIUM BROMIDE 10 MG/ML (PF) SYRINGE
PREFILLED_SYRINGE | INTRAVENOUS | Status: DC | PRN
  Administered 2020-11-02: 40 mg via INTRAVENOUS
  Administered 2020-11-02: 60 mg via INTRAVENOUS
  Administered 2020-11-02 (×2): 20 mg via INTRAVENOUS
  Administered 2020-11-02: 30 mg via INTRAVENOUS
  Administered 2020-11-02: 15 mg via INTRAVENOUS

## 2020-11-02 MED ORDER — SODIUM CHLORIDE 0.9% FLUSH
3.0000 mL | Freq: Two times a day (BID) | INTRAVENOUS | Status: DC
  Administered 2020-11-02: 3 mL via INTRAVENOUS

## 2020-11-02 MED ORDER — PROPOFOL 10 MG/ML IV BOLUS
INTRAVENOUS | Status: AC
  Filled 2020-11-02: qty 20

## 2020-11-02 MED ORDER — ROCURONIUM BROMIDE 10 MG/ML (PF) SYRINGE
PREFILLED_SYRINGE | INTRAVENOUS | Status: AC
  Filled 2020-11-02: qty 20

## 2020-11-02 MED ORDER — TAMSULOSIN HCL 0.4 MG PO CAPS
0.4000 mg | ORAL_CAPSULE | Freq: Every evening | ORAL | Status: DC
  Administered 2020-11-02: 0.4 mg via ORAL
  Filled 2020-11-02: qty 1

## 2020-11-02 MED ORDER — THROMBIN 5000 UNITS EX SOLR
CUTANEOUS | Status: AC
  Filled 2020-11-02: qty 5000

## 2020-11-02 MED ORDER — MENTHOL 3 MG MT LOZG
1.0000 | LOZENGE | OROMUCOSAL | Status: DC | PRN
  Filled 2020-11-02: qty 9

## 2020-11-02 MED ORDER — 0.9 % SODIUM CHLORIDE (POUR BTL) OPTIME
TOPICAL | Status: DC | PRN
  Administered 2020-11-02 (×2): 1000 mL

## 2020-11-02 MED ORDER — ACETAMINOPHEN 650 MG RE SUPP: 650.0000 mg | RECTAL | Status: DC | PRN

## 2020-11-02 MED ORDER — SODIUM CHLORIDE 0.9 % IV SOLN: 250.0000 mL | INTRAVENOUS | Status: DC

## 2020-11-02 MED ORDER — VITAMIN D 25 MCG (1000 UNIT) PO TABS
2000.0000 [IU] | ORAL_TABLET | Freq: Every evening | ORAL | Status: DC
  Administered 2020-11-02: 2000 [IU] via ORAL
  Filled 2020-11-02: qty 2

## 2020-11-02 MED ORDER — LACTATED RINGERS IV SOLN: INTRAVENOUS | Status: DC

## 2020-11-02 MED ORDER — EPHEDRINE SULFATE-NACL 50-0.9 MG/10ML-% IV SOSY
PREFILLED_SYRINGE | INTRAVENOUS | Status: DC | PRN
  Administered 2020-11-02 (×5): 5 mg via INTRAVENOUS

## 2020-11-02 MED ORDER — CYANOCOBALAMIN 1000 MCG/ML IJ SOLN: 1000.0000 ug | INTRAMUSCULAR | Status: DC

## 2020-11-02 MED ORDER — PROPOFOL 10 MG/ML IV BOLUS
INTRAVENOUS | Status: DC | PRN
  Administered 2020-11-02: 150 mg via INTRAVENOUS

## 2020-11-02 MED ORDER — HYDROMORPHONE HCL 1 MG/ML IJ SOLN: 0.5000 mg | INTRAMUSCULAR | Status: DC | PRN

## 2020-11-02 MED ORDER — PHENOL 1.4 % MT LIQD
1.0000 | OROMUCOSAL | Status: DC | PRN
  Administered 2020-11-03: 1 via OROMUCOSAL
  Filled 2020-11-02: qty 177

## 2020-11-02 MED ORDER — OXYCODONE HCL 5 MG PO TABS
ORAL_TABLET | ORAL | Status: AC
  Filled 2020-11-02: qty 1

## 2020-11-02 MED ORDER — FOLIC ACID 1 MG PO TABS
1.0000 mg | ORAL_TABLET | Freq: Every evening | ORAL | Status: DC
  Administered 2020-11-02: 1 mg via ORAL
  Filled 2020-11-02: qty 1

## 2020-11-02 MED ORDER — NITROGLYCERIN 0.4 MG SL SUBL: 0.4000 mg | SUBLINGUAL_TABLET | SUBLINGUAL | Status: DC | PRN

## 2020-11-02 MED ORDER — CYCLOBENZAPRINE HCL 10 MG PO TABS
10.0000 mg | ORAL_TABLET | Freq: Three times a day (TID) | ORAL | Status: DC | PRN
  Filled 2020-11-02: qty 1

## 2020-11-02 MED ORDER — SUCCINYLCHOLINE CHLORIDE 200 MG/10ML IV SOSY
PREFILLED_SYRINGE | INTRAVENOUS | Status: AC
  Filled 2020-11-02: qty 10

## 2020-11-02 MED ORDER — ONDANSETRON HCL 4 MG PO TABS: 4.0000 mg | ORAL_TABLET | Freq: Four times a day (QID) | ORAL | Status: DC | PRN

## 2020-11-02 MED ORDER — ATORVASTATIN CALCIUM 10 MG PO TABS
5.0000 mg | ORAL_TABLET | Freq: Every day | ORAL | Status: DC
  Administered 2020-11-02: 5 mg via ORAL
  Filled 2020-11-02: qty 1

## 2020-11-02 MED ORDER — ACETAMINOPHEN 325 MG PO TABS
650.0000 mg | ORAL_TABLET | ORAL | Status: DC | PRN
  Filled 2020-11-02: qty 2

## 2020-11-02 MED ORDER — SODIUM CHLORIDE 0.9% FLUSH: 3.0000 mL | INTRAVENOUS | Status: DC | PRN

## 2020-11-02 MED ORDER — PHENYLEPHRINE HCL-NACL 10-0.9 MG/250ML-% IV SOLN
INTRAVENOUS | Status: DC | PRN
  Administered 2020-11-02: 20 ug/min via INTRAVENOUS
  Administered 2020-11-02: 75 ug/min via INTRAVENOUS
  Administered 2020-11-02: 50 ug/min via INTRAVENOUS

## 2020-11-02 MED ORDER — LACTATED RINGERS IV SOLN: INTRAVENOUS | Status: DC | PRN

## 2020-11-02 MED ORDER — FENTANYL CITRATE (PF) 250 MCG/5ML IJ SOLN
INTRAMUSCULAR | Status: AC
  Filled 2020-11-02: qty 5

## 2020-11-02 MED ORDER — ORAL CARE MOUTH RINSE: 15.0000 mL | Freq: Once | OROMUCOSAL | Status: AC

## 2020-11-02 MED ORDER — ZINC SULFATE 220 (50 ZN) MG PO CAPS
220.0000 mg | ORAL_CAPSULE | Freq: Every day | ORAL | Status: DC
  Filled 2020-11-02: qty 1

## 2020-11-02 MED ORDER — ONDANSETRON HCL 4 MG/2ML IJ SOLN
INTRAMUSCULAR | Status: DC | PRN
  Administered 2020-11-02: 4 mg via INTRAVENOUS

## 2020-11-02 MED ORDER — ONDANSETRON HCL 4 MG/2ML IJ SOLN: 4.0000 mg | Freq: Four times a day (QID) | INTRAMUSCULAR | Status: DC | PRN

## 2020-11-02 MED ORDER — OXYCODONE HCL 5 MG PO TABS
5.0000 mg | ORAL_TABLET | ORAL | Status: DC | PRN
  Administered 2020-11-02 – 2020-11-03 (×2): 5 mg via ORAL
  Filled 2020-11-02 (×3): qty 1

## 2020-11-02 MED ORDER — LIDOCAINE 2% (20 MG/ML) 5 ML SYRINGE
INTRAMUSCULAR | Status: AC
  Filled 2020-11-02: qty 5

## 2020-11-02 MED ORDER — SUCCINYLCHOLINE CHLORIDE 200 MG/10ML IV SOSY
PREFILLED_SYRINGE | INTRAVENOUS | Status: DC | PRN
  Administered 2020-11-02: 160 mg via INTRAVENOUS

## 2020-11-02 MED ORDER — PANTOPRAZOLE SODIUM 40 MG IV SOLR
40.0000 mg | Freq: Every day | INTRAVENOUS | Status: DC
  Administered 2020-11-02: 40 mg via INTRAVENOUS
  Filled 2020-11-02: qty 40

## 2020-11-02 MED ORDER — DEXAMETHASONE SODIUM PHOSPHATE 10 MG/ML IJ SOLN
10.0000 mg | Freq: Once | INTRAMUSCULAR | Status: AC
  Administered 2020-11-02: 10 mg via INTRAVENOUS
  Filled 2020-11-02: qty 1

## 2020-11-02 MED ORDER — FENTANYL CITRATE (PF) 100 MCG/2ML IJ SOLN: 25.0000 ug | INTRAMUSCULAR | Status: DC | PRN

## 2020-11-02 MED ORDER — LIDOCAINE 2% (20 MG/ML) 5 ML SYRINGE
INTRAMUSCULAR | Status: DC | PRN
  Administered 2020-11-02: 80 mg via INTRAVENOUS

## 2020-11-02 MED ORDER — CEFAZOLIN SODIUM-DEXTROSE 2-4 GM/100ML-% IV SOLN
INTRAVENOUS | Status: AC
  Filled 2020-11-02: qty 100

## 2020-11-02 MED ORDER — CHLORHEXIDINE GLUCONATE 0.12 % MT SOLN
15.0000 mL | Freq: Once | OROMUCOSAL | Status: AC
  Administered 2020-11-02: 15 mL via OROMUCOSAL
  Filled 2020-11-02: qty 15

## 2020-11-02 MED ORDER — ONDANSETRON HCL 4 MG/2ML IJ SOLN
INTRAMUSCULAR | Status: AC
  Filled 2020-11-02: qty 2

## 2020-11-02 MED ORDER — CEFAZOLIN SODIUM-DEXTROSE 2-4 GM/100ML-% IV SOLN
2.0000 g | INTRAVENOUS | Status: AC
  Administered 2020-11-02 (×2): 2 g via INTRAVENOUS
  Filled 2020-11-02: qty 100

## 2020-11-02 MED ORDER — THROMBIN 5000 UNITS EX SOLR
OROMUCOSAL | Status: DC | PRN
  Administered 2020-11-02 (×3): 5 mL via TOPICAL

## 2020-11-02 MED ORDER — NITROFURANTOIN MACROCRYSTAL 100 MG PO CAPS
100.0000 mg | ORAL_CAPSULE | Freq: Every day | ORAL | Status: DC
  Filled 2020-11-02: qty 1

## 2020-11-02 MED ORDER — OXYCODONE HCL 5 MG/5ML PO SOLN: 5.0000 mg | Freq: Once | ORAL | Status: AC | PRN

## 2020-11-02 MED ORDER — SUGAMMADEX SODIUM 500 MG/5ML IV SOLN
INTRAVENOUS | Status: AC
  Filled 2020-11-02: qty 5

## 2020-11-02 MED ORDER — SUGAMMADEX SODIUM 200 MG/2ML IV SOLN
INTRAVENOUS | Status: DC | PRN
  Administered 2020-11-02: 300 mg via INTRAVENOUS

## 2020-11-02 MED ORDER — PROMETHAZINE HCL 25 MG/ML IJ SOLN: 6.2500 mg | INTRAMUSCULAR | Status: DC | PRN

## 2020-11-02 MED ORDER — ATENOLOL 50 MG PO TABS
50.0000 mg | ORAL_TABLET | Freq: Every evening | ORAL | Status: DC
  Administered 2020-11-02: 50 mg via ORAL
  Filled 2020-11-02: qty 1

## 2020-11-02 SURGICAL SUPPLY — 76 items
BAG COUNTER SPONGE SURGICOUNT (BAG) ×4 IMPLANT
BAND RUBBER #18 3X1/16 STRL (MISCELLANEOUS) ×4 IMPLANT
BASKET BONE COLLECTION (BASKET) ×2 IMPLANT
BENZOIN TINCTURE PRP APPL 2/3 (GAUZE/BANDAGES/DRESSINGS) ×2 IMPLANT
BIT DRILL 13 (BIT) ×2 IMPLANT
BIT DRILL NEURO 2X3.1 SFT TUCH (MISCELLANEOUS) ×1 IMPLANT
BONE VIVIGEN FORMABLE 1.3CC (Bone Implant) ×4 IMPLANT
BUR MATCHSTICK NEURO 3.0 LAGG (BURR) ×2 IMPLANT
CANISTER SUCT 3000ML PPV (MISCELLANEOUS) ×2 IMPLANT
CARTRIDGE OIL MAESTRO DRILL (MISCELLANEOUS) ×1 IMPLANT
DECANTER SPIKE VIAL GLASS SM (MISCELLANEOUS) ×2 IMPLANT
DERMABOND ADVANCED (GAUZE/BANDAGES/DRESSINGS) ×1
DERMABOND ADVANCED .7 DNX12 (GAUZE/BANDAGES/DRESSINGS) ×1 IMPLANT
DIFFUSER DRILL AIR PNEUMATIC (MISCELLANEOUS) ×2 IMPLANT
DRAIN JACKSON PRT FLT 7MM (DRAIN) ×2 IMPLANT
DRAPE C-ARM 42X72 X-RAY (DRAPES) ×4 IMPLANT
DRAPE LAPAROTOMY 100X72 PEDS (DRAPES) ×2 IMPLANT
DRAPE MICROSCOPE LEICA (MISCELLANEOUS) ×2 IMPLANT
DRILL NEURO 2X3.1 SOFT TOUCH (MISCELLANEOUS) ×2
DRSG OPSITE POSTOP 3X4 (GAUZE/BANDAGES/DRESSINGS) ×2 IMPLANT
DRSG OPSITE POSTOP 4X6 (GAUZE/BANDAGES/DRESSINGS) ×2 IMPLANT
DURAPREP 6ML APPLICATOR 50/CS (WOUND CARE) ×2 IMPLANT
ELECT COATED BLADE 2.86 ST (ELECTRODE) ×2 IMPLANT
ELECT REM PT RETURN 9FT ADLT (ELECTROSURGICAL) ×2
ELECTRODE REM PT RTRN 9FT ADLT (ELECTROSURGICAL) ×1 IMPLANT
EVACUATOR SILICONE 100CC (DRAIN) ×2 IMPLANT
GAUZE 4X4 16PLY ~~LOC~~+RFID DBL (SPONGE) ×2 IMPLANT
GAUZE SPONGE 4X4 12PLY STRL (GAUZE/BANDAGES/DRESSINGS) ×2 IMPLANT
GLOVE EXAM NITRILE XL STR (GLOVE) IMPLANT
GLOVE SRG 8 PF TXTR STRL LF DI (GLOVE) ×1 IMPLANT
GLOVE SURG ENC MOIS LTX SZ7 (GLOVE) ×2 IMPLANT
GLOVE SURG ENC MOIS LTX SZ8 (GLOVE) ×2 IMPLANT
GLOVE SURG UNDER LTX SZ8.5 (GLOVE) ×2 IMPLANT
GLOVE SURG UNDER POLY LF SZ6.5 (GLOVE) ×4 IMPLANT
GLOVE SURG UNDER POLY LF SZ7 (GLOVE) ×4 IMPLANT
GLOVE SURG UNDER POLY LF SZ7.5 (GLOVE) ×6 IMPLANT
GLOVE SURG UNDER POLY LF SZ8 (GLOVE) ×1
GOWN STRL REUS W/ TWL LRG LVL3 (GOWN DISPOSABLE) ×2 IMPLANT
GOWN STRL REUS W/ TWL XL LVL3 (GOWN DISPOSABLE) ×2 IMPLANT
GOWN STRL REUS W/TWL 2XL LVL3 (GOWN DISPOSABLE) IMPLANT
GOWN STRL REUS W/TWL LRG LVL3 (GOWN DISPOSABLE) ×2
GOWN STRL REUS W/TWL XL LVL3 (GOWN DISPOSABLE) ×2
HALTER HD/CHIN CERV TRACTION D (MISCELLANEOUS) ×2 IMPLANT
HEMOSTAT POWDER KIT SURGIFOAM (HEMOSTASIS) ×6 IMPLANT
HEMOSTAT POWDER SURGIFOAM 1G (HEMOSTASIS) ×2 IMPLANT
KIT BASIN OR (CUSTOM PROCEDURE TRAY) ×2 IMPLANT
KIT TURNOVER KIT B (KITS) ×2 IMPLANT
NEEDLE HYPO 18GX1.5 BLUNT FILL (NEEDLE) ×2 IMPLANT
NEEDLE SPNL 20GX3.5 QUINCKE YW (NEEDLE) ×2 IMPLANT
NS IRRIG 1000ML POUR BTL (IV SOLUTION) ×4 IMPLANT
OIL CARTRIDGE MAESTRO DRILL (MISCELLANEOUS) ×2
PACK LAMINECTOMY NEURO (CUSTOM PROCEDURE TRAY) ×2 IMPLANT
PAD ARMBOARD 7.5X6 YLW CONV (MISCELLANEOUS) ×6 IMPLANT
PIN DISTRACTION 14MM (PIN) ×4 IMPLANT
PLATE 4 77.5XLCK NS SPNE CVD (Plate) ×1 IMPLANT
PLATE 4 ATLANTIS TRANS (Plate) ×1 IMPLANT
SCREW 4.0X15MM (Screw) ×2 IMPLANT
SCREW ST 15X4XST FXANG NS (Screw) ×7 IMPLANT
SCREW ST 15X4XST VA NS SPNE (Screw) ×1 IMPLANT
SCREW ST FIX 4 ATL (Screw) ×7 IMPLANT
SCREW ST VAR 4 ATL (Screw) ×1 IMPLANT
SCREW VA SD 4.5X15 (Screw) ×2 IMPLANT
SPACER HEDRON 14X16X6 0D (Spacer) ×2 IMPLANT
SPACER HEDRON 14X16X7 0D (Spacer) ×2 IMPLANT
SPACER HEDRON 14X16X8 0D (Spacer) ×2 IMPLANT
SPACER HEDRON C 12X14X6 0D (Spacer) ×2 IMPLANT
SPONGE INTESTINAL PEANUT (DISPOSABLE) ×2 IMPLANT
SPONGE LAP 4X18 RFD (DISPOSABLE) ×4 IMPLANT
SPONGE SURGIFOAM ABS GEL 100 (HEMOSTASIS) ×2 IMPLANT
STRIP CLOSURE SKIN 1/2X4 (GAUZE/BANDAGES/DRESSINGS) ×2 IMPLANT
SUT VIC AB 3-0 SH 8-18 (SUTURE) ×2 IMPLANT
SUT VIC AB 4-0 PS2 27 (SUTURE) ×2 IMPLANT
TAPE CLOTH 4X10 WHT NS (GAUZE/BANDAGES/DRESSINGS) ×2 IMPLANT
TOWEL GREEN STERILE (TOWEL DISPOSABLE) ×2 IMPLANT
TOWEL GREEN STERILE FF (TOWEL DISPOSABLE) ×2 IMPLANT
WATER STERILE IRR 1000ML POUR (IV SOLUTION) ×2 IMPLANT

## 2020-11-02 NOTE — Op Note (Signed)
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  Utilizing the globus titanium cages packed with locally harvested autograft mixed with vivgen at all 4 levels and the Atlantis translational plating system  Surgeon: Jillyn Hidden Kashena Novitski  Assistant: Hoyt Koch  Anesthesia: General  EBL: Minimal  HPI: 75 year old gentleman progressive worsening neck pain bilateral shoulder arm pain numbness tingling weakness in his hands work-up revealed severe cervical spondylosis and stenosis and spinal cord compression from C3-C7.  Due to patient's progressive clinical syndrome imaging findings and failed conservative treatment I recommended anterior cervical discectomies and fusion at all 4 levels.  I extensively reviewed the risks and benefits of the procedure with him as well as perioperative course expectations of outcome and alternatives to surgery and he understood and agreed to proceed forward.  Operative procedure: Patient was brought into the OR was induced under general anesthesia positioned supine the neck in slight extension 5 pounds halter traction the right side was next prepped and draped in routine sterile fashion.  Preoperative x-ray localized the appropriate level.  So a curvilinear incision was made just off the midline to the anterior border of the sternocleidomastoid and the superficial layer platysma was dissected out divided longitudinally the avascular plane between the sternomastoid and strap muscle was developed down to the previous fashion.  Fascia was dissected with Kitners.  Intraoperative x-ray confirmed identification appropriate level.  I removed large anterior osteophytes at C3-4 and C4-5 with a Leksell rongeur dissected longus coli laterally and self-retaining retractor was placed and first attention was  taken at C5-6 and C6-7.  Disc bases were drilled down capturing the bone shavings and mucus trap and under microscopic lamination further drilled out the disc bases were removed large spurs on the posterior endplate primarily at C6-7 aggressive under biting of both endplates identified the posterior longitudinal ligament which was removed in piecemeal fashion and by decompressing central canal marching laterally both C7 pedicles identified both C7 nerve roots were skeletonized and decompressed flush with the pedicle.  After adequate decompression been achieved centrally and foraminally attention was then taken at C5-6 in a similar fashion the disc base was drilled down with a high-speed drill aggressive running up biting of both endplates and removal of the posterior longitudinal ligament decompressing central canal both C6 pedicles were identified both C6 nerve roots were decompressed and skeletonized flush with the pedicle then I went ahead and inserted the cages at these levels after packing the cages with locally harvested autograft mix utilized an 8 mm cage at C6-7 a 5 mm at C5-6 after both cages were inserted I reposition the retractor for C3-4 and C4-5 in a similar fashion large anterior osteophytes were aggressively removed both with a Leksell rongeur and osteophyte remover pituitary rongeurs and a high-speed drill.  First working at C3-4 this disc base was drilled down the posterior osteophytic complex primarily the pathology here was posterior coming off the C3 vertebral body this was all removed aggressive under biting both endplates and remove the posterior longitudinal ligament decompressing central canal and both C4 nerve roots were skeletonized and decompressed flush the pedicle.  This was then packed with Surgifoam copiously irrigated out and a 6 mm implant was inserted at C3-4 then at C4-5 in a similar fashion disc base was drilled down posterior osteophytes were removed both C5 pedicles were  identified both C5 nerve roots were decompressed  and the central canal was decompressed as well at this level I placed a 6 mm cage.  Then after all the cage bending inserted and confirmed good position with fluoroscopy I selected a 75 mm Atlantis translational plate used combination of rescue screws and fixed angle screws and variable screws but all screws ultimately had excellent purchase with Anchorage and vertebral bodies of C3, C4, C5, C6, and C7.  Wounds are copiously irrigated to Kassim states was maintained some additional bone graft had been packed laterally to the cages prior to prior to plate placement then I placed a JP drain and the wound was closed in layers with active Vicryl and a running 4 subcuticular.  Dermabond benzoin Steri-Strips and a sterile dressing was applied patient recovery in stable condition.  At the end the case all needle counts and sponge counts were correct.

## 2020-11-02 NOTE — Anesthesia Procedure Notes (Signed)
Procedure Name: Intubation Date/Time: 11/02/2020 7:44 AM Performed by: Marena Chancy, CRNA Pre-anesthesia Checklist: Patient identified, Emergency Drugs available, Suction available and Patient being monitored Patient Re-evaluated:Patient Re-evaluated prior to induction Oxygen Delivery Method: Circle System Utilized Preoxygenation: Pre-oxygenation with 100% oxygen Induction Type: IV induction Ventilation: Mask ventilation without difficulty Laryngoscope Size: Glidescope and 4 Grade View: Grade I Tube type: Oral Tube size: 8.0 mm Number of attempts: 1 Airway Equipment and Method: Stylet and Oral airway Placement Confirmation: ETT inserted through vocal cords under direct vision, positive ETCO2 and breath sounds checked- equal and bilateral Tube secured with: Tape Dental Injury: Teeth and Oropharynx as per pre-operative assessment

## 2020-11-02 NOTE — Anesthesia Procedure Notes (Signed)
Arterial Line Insertion Start/End7/29/2022 7:10 AM, 11/02/2020 7:15 AM Performed by: CRNA  Patient location: Pre-op. Preanesthetic checklist: patient identified, IV checked, site marked, risks and benefits discussed, surgical consent, monitors and equipment checked, pre-op evaluation, timeout performed and anesthesia consent Lidocaine 1% used for infiltration Right, radial was placed Catheter size: 20 G Hand hygiene performed  and maximum sterile barriers used   Attempts: 1 Procedure performed without using ultrasound guided technique. Following insertion, dressing applied and Biopatch. Patient tolerated the procedure well with no immediate complications.

## 2020-11-02 NOTE — H&P (Signed)
Christopher Goodwin is an 75 y.o. male.   Chief Complaint: Neck pain left greater than right shoulder and arm pain HPI: 75 year old gentleman because worsening neck pain and shoulder and arm pain weakness in his hands fatigability.  Work-up revealed severe cervical spondylosis with stenosis and spinal cord compression from C3-C7.  Due to the patient's progression of clinical syndrome imaging findings and failed conservative treatment I recommended anterior cervical discectomies and fusion at C3-4, C4-5, C5-6, and C6-7.  I extensively went over the risks and benefits of that operation with him as well as perioperative course expectations of outcome and alternatives of surgery and he understood and agreed to proceed forward.  Past Medical History:  Diagnosis Date   Anticoagulated on Coumadin    Arthritis    BPH (benign prostatic hyperplasia)    Cardiomyopathy, dilated, nonischemic (HCC)    by 2008 cath and 2013 stress test results; as of 12/28/14: has been followed by Dr. Loura Halt in [place] (last seen 11/2014), moved to [facility] 12/2014   Chronic atrial fibrillation [facility])    cardiologist-  dr Jacinto Halim---  per pt lov 10/ 2018   Dilated aortic root (HCC)    4.9 cm by 05/26/14 echo [facility], Dr. Rolene Arbour)   Dysrhythmia    Foley catheter in place    Hyperlipidemia    Permanent atrial fibrillation (HCC) 09/01/2018   PONV (postoperative nausea and vomiting)    Thoracic ascending aortic aneurysm (HCC)    per CT 08-10-2015  4.0cm   Urinary retention    Wears glasses     Past Surgical History:  Procedure Laterality Date   BACK SURGERY     CARDIAC CATHETERIZATION  04/10/2006  [place], [place]    [facility]): Mild plaquing w/o obstructive CAD, LVEF 40-45%, normal right heart pressures.    CARDIOVASCULAR STRESS TEST  01-09-2017   DR Jacinto Halim   Low risk myocardial perfusion study w/ no ischemia/  LV function 45% (but visually appears normal)   CYSTOSCOPY N/A 01/30/2017    Procedure: CYSTOSCOPY;  Surgeon: Alfredo Martinez, MD;  Location: [facility];  Service: Urology;  Laterality: N/A;   JOINT REPLACEMENT     LUMBAR LAMINECTOMY/DECOMPRESSION MICRODISCECTOMY Left 01/03/2015   Procedure: Lumbar four, lumbar five left Laminectomy and Foraminotomy ;  Surgeon: Donalee Citrin, MD;  Location: [facility];  Service: Neurosurgery;  Laterality: Left;   TOTAL KNEE ARTHROPLASTY Bilateral 2007;  2003   TRANSTHORACIC ECHOCARDIOGRAM  09-04-2016   DR Jacinto Halim   moderate LVH, ef 45%, mild generalized hypokinesis/ due to Afib unable to evaluate diastolic function/  mild LAE/  trivial MR & TR/  mild PR /  moderate dilated aortic root 4.8cm , mild atherosclerotic aorta/  IVC dilated with respiratory variation   TRANSURETHRAL RESECTION OF PROSTATE N/A 01/30/2017   Procedure: TRANSURETHRAL RESECTION OF THE PROSTATE (TURP);  Surgeon: Alfredo Martinez, MD;  Location: [facility];  Service: Urology;  Laterality: N/A;    Family History  Problem Relation Age of Onset   Hypertension Mother    Diabetes Brother    Social History:  reports that he quit smoking about 5 years ago. His smoking use included cigars. He has never used smokeless tobacco. He reports current alcohol use of about 2.0 standard drinks of alcohol per week. He reports that he does not use drugs.  Allergies: No Known Allergies  Medications Prior to Admission  Medication Sig Dispense Refill   atenolol (TENORMIN) 50 MG tablet Take 50 mg  by mouth every evening.      atorvastatin (LIPITOR) 10 MG tablet TAKE ONE-HALF TABLET BY MOUTH ONCE DAILYAS DIRECTED (Patient taking differently: Take 5 mg by mouth daily.) 90 tablet 3   Cholecalciferol (VITAMIN D) 2000 UNITS CAPS Take 2,000 Units every evening by mouth.      cyanocobalamin (,VITAMIN B-12,) 1000 MCG/ML injection Inject 1,000 mcg into the muscle every 30 (thirty) days.     folic acid (FOLVITE) 1 MG tablet Take 1 mg every evening by mouth.       nitrofurantoin (MACRODANTIN) 100 MG capsule Take 100 mg by mouth daily.     tamsulosin (FLOMAX) 0.4 MG CAPS capsule Take 0.4 mg every evening by mouth.  10   vitamin C (ASCORBIC ACID) 500 MG tablet Take 500 mg by mouth daily.     warfarin (COUMADIN) 6 MG tablet Take 6 mg every evening by mouth.      zinc gluconate 50 MG tablet Take 50 mg by mouth daily.     nitroGLYCERIN (NITROSTAT) 0.4 MG SL tablet Place 0.4 mg under the tongue every 5 (five) minutes as needed for chest pain.      No results found for this or any previous visit (from the past 48 hour(s)). No results found.  Review of Systems  Musculoskeletal:  Positive for neck pain.  Neurological:  Positive for weakness and numbness.   Blood pressure 100/72, pulse 61, temperature 97.6 F (36.4 C), temperature source Oral, resp. rate 18, height 6\' 2"  (1.88 m), weight 123.8 kg, SpO2 98 %. Physical Exam HENT:     Head: Normocephalic.     Right Ear: Tympanic membrane normal.     Nose: Nose normal.     Mouth/Throat:     Mouth: Mucous membranes are moist.  Eyes:     Pupils: Pupils are equal, round, and reactive to light.  Cardiovascular:     Rate and Rhythm: Normal rate.  Pulmonary:     Effort: Pulmonary effort is normal.  Abdominal:     General: Abdomen is flat.  Musculoskeletal:        General: Normal range of motion.  Skin:    General: Skin is warm.  Neurological:     General: No focal deficit present.     Mental Status: He is alert.     Comments: Patient is awake and alert strength is 5/5 deltoid, bicep, tricep, wrist flexion, wrist extension, hand intrinsics patient is awake and alert strength is 5 out of 5 deltoid, bicep, tricep, wrist extension, wrist flexion, hand intrinsics.     Assessment/Plan 75 year old gentleman presents for ACDF C3-4, C4-5, C5-6, C6-7  61, MD 11/02/2020, 7:28 AM

## 2020-11-02 NOTE — Transfer of Care (Signed)
Immediate Anesthesia Transfer of Care Note  Patient: Christopher Goodwin  Procedure(s) Performed: Anterior Cervical Decompression Fusion - Cervical three-Cervical four - Cervical four-Cervical five - Cervical five-Cervical six - Cervical six-Cervical seven  Patient Location: PACU  Anesthesia Type:General  Level of Consciousness: awake, alert  and oriented  Airway & Oxygen Therapy: Patient Spontanous Breathing and Patient connected to face mask oxygen  Post-op Assessment: Report given to RN, Post -op Vital signs reviewed and stable and Patient moving all extremities X 4  Post vital signs: Reviewed and stable  Last Vitals:  Vitals Value Taken Time  BP 123/91 11/02/20 1249  Temp    Pulse 36 11/02/20 1251  Resp 23 11/02/20 1251  SpO2 95 % 11/02/20 1251  Vitals shown include unvalidated device data.  Last Pain:  Vitals:   11/02/20 0558  TempSrc:   PainSc: 0-No pain         Complications: No notable events documented.

## 2020-11-03 MED ORDER — OXYCODONE HCL 5 MG PO TABS: 5.0000 mg | ORAL_TABLET | ORAL | 0 refills | Status: DC | PRN

## 2020-11-03 MED ORDER — WARFARIN SODIUM 6 MG PO TABS: 6.0000 mg | ORAL_TABLET | Freq: Every evening | ORAL | 0 refills | Status: AC

## 2020-11-03 MED ORDER — CYCLOBENZAPRINE HCL 10 MG PO TABS: 10.0000 mg | ORAL_TABLET | Freq: Three times a day (TID) | ORAL | 0 refills | Status: DC | PRN

## 2020-11-03 NOTE — Evaluation (Signed)
Physical Therapy Evaluation Patient Details Name: Christopher Goodwin MRN: [id number] DOB: March 23, 1946 Today's Date: 11/03/2020   History of Present Illness  75 year old gentleman presents for ACDF C3-4, C4-5, C5-6, C6-7. PMH: afib, hyperlipidemia PSH: bilat TKA, back surgery   Clinical Impression  Pt admitted for above. Pt denies pain and reports "my arms feel back to normal." Pt with unsteady, staggering gait running into obstacles on the R with impulsively fast gait speed, however suspect this is his baseline. Pt declined use of cane. Pt with good understanding of cervical precautions however required verbal cues to adhere to functionally. Acute PT to cont to follow.    Follow Up Recommendations No PT follow up;Supervision/Assistance - 24 hour    Equipment Recommendations  None recommended by PT (would benefit from cane however pt reports "I have on if I need it")    Recommendations for Other Services       Precautions / Restrictions Precautions Precautions: Cervical Precaution Booklet Issued: Yes (comment) Precaution Comments: pt with good understanding Restrictions Weight Bearing Restrictions: No      Mobility  Bed Mobility Overal bed mobility: Needs Assistance Bed Mobility: Rolling;Sidelying to Sit Rolling: Supervision Sidelying to sit: Supervision       General bed mobility comments: directional verbal cues for log roll technique, discussed returning to supine via reverse log roll, not bed rail used to mimic home set up, pt reports his wife bought him a wedge to sleep on to elevate trunk    Transfers Overall transfer level: Needs assistance Equipment used: None Transfers: Sit to/from Stand Sit to Stand: Min guard         General transfer comment: pt quick to move, wide base of support, forward trunk flexion requiring pt to quickly move his feet to prevent fall, suspect his is baseline  Ambulation/Gait Ambulation/Gait assistance: Min guard Gait Distance (Feet):  200 Feet Assistive device: None Gait Pattern/deviations: Step-through pattern;Decreased stride length;Trunk flexed;Staggering right Gait velocity: impulsively fst Gait velocity interpretation: 1.31 - 2.62 ft/sec, indicative of limited community ambulator General Gait Details: pt with staggering L and R, more to the R, bumped into hospital bed in hallway on the right and wasn't phased by it, pt ambulating impulsively fast, when asked how this is compared to his walking ability PTA pt stated, "this is much better"  Stairs            Wheelchair Mobility    Modified Rankin (Stroke Patients Only)       Balance Overall balance assessment: Needs assistance Sitting-balance support: Feet supported;No upper extremity supported Sitting balance-Leahy Scale: Good     Standing balance support: No upper extremity supported;During functional activity Standing balance-Leahy Scale: Fair Standing balance comment: pt staggering with amb, okay statically                             Pertinent Vitals/Pain Pain Assessment: 0-10 Pain Score: 0-No pain    Home Living Family/patient expects to be discharged to:: Private residence (retirement community) Living Arrangements: Spouse/significant other Available Help at Discharge: Family;Available 24 hours/day Type of Home: House Home Access: Level entry     Home Layout: One level Home Equipment: Walker - 2 wheels;Cane - single point;Shower seat - built in      Prior Function Level of Independence: Independent               Hand Dominance   Dominant Hand: Right    Extremity/Trunk Assessment  Upper Extremity Assessment Upper Extremity Assessment: Overall WFL for tasks assessed    Lower Extremity Assessment Lower Extremity Assessment: Overall WFL for tasks assessed    Cervical / Trunk Assessment Cervical / Trunk Assessment: Other exceptions Cervical / Trunk Exceptions: neck surgery  Communication   Communication:  No difficulties  Cognition Arousal/Alertness: Awake/alert Behavior During Therapy: WFL for tasks assessed/performed Overall Cognitive Status: No family/caregiver present to determine baseline cognitive functioning                                 General Comments: pt with mild impulsivity and decreased insight to safety however suspect this is his baseline personality      General Comments General comments (skin integrity, edema, etc.): pt with jp drain from anterior neck intact    Exercises     Assessment/Plan    PT Assessment Patient needs continued PT services  PT Problem List Decreased strength;Decreased activity tolerance;Decreased balance;Decreased mobility;Decreased safety awareness       PT Treatment Interventions DME instruction;Gait training;Stair training;Functional mobility training;Therapeutic activities;Therapeutic exercise;Balance training;Neuromuscular re-education    PT Goals (Current goals can be found in the Care Plan section)  Acute Rehab PT Goals Patient Stated Goal: home this morning PT Goal Formulation: With patient Time For Goal Achievement: 11/16/20 Potential to Achieve Goals: Good    Frequency Min 5X/week   Barriers to discharge        Co-evaluation               AM-PAC PT "6 Clicks" Mobility  Outcome Measure Help needed turning from your back to your side while in a flat bed without using bedrails?: None Help needed moving from lying on your back to sitting on the side of a flat bed without using bedrails?: None Help needed moving to and from a bed to a chair (including a wheelchair)?: None Help needed standing up from a chair using your arms (e.g., wheelchair or bedside chair)?: None Help needed to walk in hospital room?: A Little Help needed climbing 3-5 steps with a railing? : A Little 6 Click Score: 22    End of Session Equipment Utilized During Treatment: Gait belt Activity Tolerance: Patient tolerated treatment  well Patient left: in chair;with call bell/phone within reach Nurse Communication: Mobility status PT Visit Diagnosis: Unsteadiness on feet (R26.81);Muscle weakness (generalized) (M62.81);Difficulty in walking, not elsewhere classified (R26.2)    Time: 7425-9563 PT Time Calculation (min) (ACUTE ONLY): 20 min   Charges:   PT Evaluation $PT Eval Low Complexity: 1 Low          Lewis Shock, PT, DPT Acute Rehabilitation Services Pager #: [phone] Office #: [phone]   Iona Hansen 11/03/2020, 7:28 AM

## 2020-11-03 NOTE — Plan of Care (Signed)
Adequately ready for discharge 

## 2020-11-03 NOTE — Progress Notes (Signed)
Occupational Therapy Evaluation Patient Details Name: Christopher Goodwin MRN: [id number] DOB: [birth year] Today's Date: 11/03/2020    History of Present Illness 75 year old gentleman presents for ACDF C3-4, C4-5, C5-6, C6-7. PMH: afib, hyperlipidemia PSH: bilat TKA, back surgery   Clinical Impression   [name] was evaluated s/p the above neck surgery. PTA pt was indep in all ADL/IADLs and lives in a retirement community with his wife. Upon evaluation pt verbalized understanding of all cervical precautions and demonstrated ability to log roll. Pt is min guard for mobility without AD, and would likely benefit from a cane, which he has at home. He is set up level for upper body ADLs, and mod A for lower body ADLs to maintain back precautions. Pt verbalized understanding of compensatory techniques for grooming at the sink. Pt's wife was present and supportive this session. Pt does not need further acute OT. Recommend d/c home with supervision for all mobility and ADLs.     Follow Up Recommendations  No OT follow up;Supervision - Intermittent    Equipment Recommendations  None recommended by OT       Precautions / Restrictions Precautions Precautions: Cervical Precaution Booklet Issued: Yes (comment) Precaution Comments: pt recalled 3/3 Restrictions Weight Bearing Restrictions: No Other Position/Activity Restrictions: No lifting mroe than 5 lbs      Mobility Bed Mobility Overal bed mobility: Needs Assistance Bed Mobility: Rolling;Sidelying to Sit Rolling: Supervision Sidelying to sit: Supervision       General bed mobility comments: vc for log rolling, pt with good understanding. Pt has a wedge to sleep on at home    Transfers Overall transfer level: Needs assistance Equipment used: None Transfers: Sit to/from Stand Sit to Stand: Min guard         General transfer comment: pt quick to move, wide base of support, forward trunk flexion requiring pt to quickly move his feet to  prevent fall, suspect his is baseline    Balance Overall balance assessment: Needs assistance Sitting-balance support: Feet supported;No upper extremity supported Sitting balance-Leahy Scale: Good     Standing balance support: No upper extremity supported;During functional activity Standing balance-Leahy Scale: Fair Standing balance comment: pt staggering with amb, okay statically             ADL either performed or assessed with clinical judgement   ADL Overall ADL's : Needs assistance/impaired Eating/Feeding: Independent;Sitting   Grooming: Wash/dry face;Wash/dry hands;Oral care;Applying deodorant;Minimal assistance;Cueing for compensatory techniques;Sitting   Upper Body Bathing: Min guard;Cueing for compensatory techniques;Sitting   Lower Body Bathing: Minimal assistance;Cueing for compensatory techniques;Sit to/from stand   Upper Body Dressing : Set up;Sitting   Lower Body Dressing: Moderate assistance;Cueing for compensatory techniques;Sit to/from stand   Toilet Transfer: Min guard;Ambulation;Comfort height toilet;Grab bars   Toileting- Clothing Manipulation and Hygiene: Min guard;Sit to/from stand       Functional mobility during ADLs: Min guard;Cueing for safety General ADL Comments: pt requires assistance for lower body ADLs to maintain cervical precautions, vc throughout for compensatory technqiues     Vision Baseline Vision/History: Wears glasses Wears Glasses: At all times Patient Visual Report: No change from baseline        Pertinent Vitals/Pain Pain Assessment: No/denies pain Pain Score: 0-No pain     Hand Dominance Right   Extremity/Trunk Assessment Upper Extremity Assessment Upper Extremity Assessment: Overall WFL for tasks assessed   Lower Extremity Assessment Lower Extremity Assessment: Overall WFL for tasks assessed   Cervical / Trunk Assessment Cervical / Trunk Assessment: Other exceptions Cervical /  Trunk Exceptions: neck surgery    Communication Communication Communication: No difficulties   Cognition Arousal/Alertness: Awake/alert Behavior During Therapy: WFL for tasks assessed/performed Overall Cognitive Status: No family/caregiver present to determine baseline cognitive functioning               General Comments: PT with good understanding of precautions but poor insight into problem solving to maintain precautions during ADLs, likely baseline   General Comments  vss on RA, wife present and supportive throughout session            Home Living Family/patient expects to be discharged to:: Private residence Living Arrangements: Spouse/significant other Available Help at Discharge: Family;Available 24 hours/day Type of Home: House Home Access: Level entry     Home Layout: One level     Bathroom Shower/Tub: Producer, television/film/video: Handicapped height Bathroom Accessibility: Yes   Home Equipment: Environmental consultant - 2 wheels;Cane - single point;Shower seat - built in   Additional Comments: pt & his wife live in a retierment community      Prior Functioning/Environment Level of Independence: Independent                 OT Problem List: Decreased strength;Decreased range of motion;Decreased activity tolerance;Impaired balance (sitting and/or standing);Decreased knowledge of use of DME or AE;Decreased safety awareness;Decreased knowledge of precautions;Pain      OT Treatment/Interventions: Self-care/ADL training;Therapeutic exercise;DME and/or AE instruction;Therapeutic activities;Patient/family education    OT Goals(Current goals can be found in the care plan section) Acute Rehab OT Goals Patient Stated Goal: home this morning OT Goal Formulation: All assessment and education complete, DC therapy  OT Frequency: Min 2X/week    AM-PAC OT "6 Clicks" Daily Activity     Outcome Measure Help from another person eating meals?: None Help from another person taking care of personal grooming?:  A Little Help from another person toileting, which includes using toliet, bedpan, or urinal?: A Little Help from another person bathing (including washing, rinsing, drying)?: A Little Help from another person to put on and taking off regular upper body clothing?: A Little Help from another person to put on and taking off regular lower body clothing?: A Lot 6 Click Score: 18   End of Session Equipment Utilized During Treatment: Cervical collar Nurse Communication: Mobility status;Precautions;Weight bearing status  Activity Tolerance: Patient tolerated treatment well Patient left: in bed;with call bell/phone within reach;with family/visitor present  OT Visit Diagnosis: Unsteadiness on feet (R26.81);Muscle weakness (generalized) (M62.81)                Time: 0938-1829 OT Time Calculation (min): 22 min Charges:  OT General Charges $OT Visit: 1 Visit OT Evaluation $OT Eval Low Complexity: 1 Low    Aaditya Letizia A Jatniel Verastegui 11/03/2020, 8:10 AM

## 2020-11-03 NOTE — Discharge Summary (Addendum)
  Physician Discharge Summary  Patient ID: Christopher Goodwin MRN: [id number] DOB/AGE: 1946-03-08 75 y.o.  Admit date: 11/02/2020 Discharge date: 11/03/2020  Admission Diagnoses:  Cervical spondylosis with stenosis C3-7  Discharge Diagnoses:  Same Active Problems:   Spinal stenosis in cervical region   Discharged Condition: Stable  Hospital Course:  Christopher Goodwin is a 74 y.o. male that presented for an elective C3-7 ACDF.  He tolerated surgery well.  Postoperatively his pain was controlled on oral medication.  He was at his neurologic baseline and had improvement in his numbness and tingling.  He was tolerating a normal diet.  His incision was clean dry and intact.  His neck was soft.  He was ambulating well with physical therapy.  He was instructed to restart his anticoagulation, Coumadin in 2 weeks.  Treatments: Surgery -ACDF C3-7  Discharge Exam: Blood pressure 108/80, pulse 73, temperature 97.6 F (36.4 C), temperature source Oral, resp. rate 18, height 6\' 2"  (1.88 m), weight 123.8 kg, SpO2 99 %. Awake, alert, oriented x3 PERRLA Speech fluent, appropriate CN grossly intact 5/5 BUE/BLE Wound c/d/I Neck soft, trachea midline. Normal phonation  Disposition: Discharge disposition: 01-Home or Self Care       Allergies as of 11/03/2020   No Known Allergies      Medication List     TAKE these medications    atenolol 50 MG tablet Commonly known as: TENORMIN Take 50 mg by mouth every evening.   cyanocobalamin 1000 MCG/ML injection Commonly known as: (VITAMIN B-12) Inject 1,000 mcg into the muscle every 30 (thirty) days.   cyclobenzaprine 10 MG tablet Commonly known as: FLEXERIL Take 1 tablet (10 mg total) by mouth 3 (three) times daily as needed for muscle spasms.   folic acid 1 MG tablet Commonly known as: FOLVITE Take 1 mg every evening by mouth.   nitrofurantoin 100 MG capsule Commonly known as: MACRODANTIN Take 100 mg by mouth daily.   nitroGLYCERIN  0.4 MG SL tablet Commonly known as: NITROSTAT Place 0.4 mg under the tongue every 5 (five) minutes as needed for chest pain.   oxyCODONE 5 MG immediate release tablet Commonly known as: Oxy IR/ROXICODONE Take 1-2 tablets (5-10 mg total) by mouth every 3 (three) hours as needed for severe pain or moderate pain ((score 7 to 10)).   tamsulosin 0.4 MG Caps capsule Commonly known as: FLOMAX Take 0.4 mg every evening by mouth.   vitamin C 500 MG tablet Commonly known as: ASCORBIC ACID Take 500 mg by mouth daily.   Vitamin D 50 MCG (2000 UT) Caps Take 2,000 Units every evening by mouth.   warfarin 6 MG tablet Commonly known as: COUMADIN Take 1 tablet (6 mg total) by mouth every evening. Start taking on: November 16, 2020 What changed: These instructions start on November 16, 2020. If you are unsure what to do until then, ask your doctor or other care provider.   zinc gluconate 50 MG tablet Take 50 mg by mouth daily.       ASK your doctor about these medications    atorvastatin 10 MG tablet Commonly known as: LIPITOR TAKE ONE-HALF TABLET BY MOUTH ONCE DAILYAS DIRECTED         Signed: November 18, 2020 Yanissa Michalsky 11/03/2020, 8:56 AM

## 2020-11-03 NOTE — Discharge Instructions (Signed)
Wound Care  Keep the incision clean and dry remove the outer dressing in 3 days, leave the Steri-Strips intact. Wrap with Saran wrap for showers only Do not put any creams, lotions, or ointments on incision. Leave steri-strips on back.  They will fall off by themselves.  Activity Walk each and every day, increasing distance each day. No lifting greater than 5 lbs.  No lifting no bending no twisting no driving or riding a car unless coming back and forth to see me. If provided with back brace, wear when out of bed.  It is not necessary to wear brace in bed. Diet Resume your normal diet.    Call Your Doctor If Any of These Occur Redness, drainage, or swelling at the wound.  Temperature greater than 101 degrees. Severe pain not relieved by pain medication. Incision starts to come apart. Follow Up Appt Call today for appointment in 1-2 weeks ([phone]) or for problems.  If you have any hardware placed in your spine, you will need an x-ray before your appointment.   

## 2020-11-05 ENCOUNTER — Telehealth: Payer: Self-pay | Admitting: Pharmacist

## 2020-11-05 ENCOUNTER — Encounter (HOSPITAL_COMMUNITY): Payer: Self-pay | Admitting: Neurosurgery

## 2020-11-05 MED FILL — Thrombin For Soln 5000 Unit: CUTANEOUS | Qty: 5000 | Status: AC

## 2020-11-05 NOTE — Telephone Encounter (Signed)
Start in 10 days. High risk surgery. I discussed with Dr. Monia Pouch

## 2020-11-05 NOTE — Anesthesia Postprocedure Evaluation (Signed)
Anesthesia Post Note  Patient: Christopher Goodwin  Procedure(s) Performed: Anterior Cervical Decompression Fusion - Cervical three-Cervical four - Cervical four-Cervical five - Cervical five-Cervical six - Cervical six-Cervical seven     Patient location during evaluation: PACU Anesthesia Type: General Level of consciousness: awake Pain management: pain level controlled Vital Signs Assessment: post-procedure vital signs reviewed and stable Respiratory status: spontaneous breathing and respiratory function stable Cardiovascular status: stable Postop Assessment: no apparent nausea or vomiting Anesthetic complications: no   No notable events documented.  Last Vitals:  Vitals:   11/03/20 0349 11/03/20 0726  BP: 111/84 108/80  Pulse: 64 73  Resp: 20 18  Temp: 36.4 C 36.4 C  SpO2: 98% 99%    Last Pain:  Vitals:   11/03/20 0726  TempSrc: Oral  PainSc:                  Mellody Dance

## 2020-11-05 NOTE — Telephone Encounter (Signed)
Pt wife called to clarify pt's recent post-op discharge instructions regarding his warfarin management. Pt s/p elective C3-7 ACDF on 11/02/20. Pt was instructed to hold warfarin for 2 weeks post-op. Pt's wife wanted to clarify with Dr. Jacinto Halim about when it would be appropriate to restart his coumadin dose. Warfarin indication of permanent Afib. CHADsVASC score of 3. INR managed through PCP's office. Pt currently when his next INR check is scheduled. Pt denies any post-op bleeding complications and recovering appropriately. Discharged home on 11/03/20. Will review with Dr. Jacinto Halim and discuss with pt.

## 2020-11-06 NOTE — Telephone Encounter (Signed)
Called and discussed with pt. Pt to restart home warfarin dose 10 days post-op. Pt to call PCP office to schedule INR check

## 2020-12-31 ENCOUNTER — Other Ambulatory Visit: Payer: Self-pay | Admitting: Cardiology

## 2020-12-31 DIAGNOSIS — E785 Hyperlipidemia, unspecified: Secondary | ICD-10-CM

## 2021-03-19 IMAGING — CT CT ANGIO CHEST
2 series · 18 of 32 positions shown · IV contrast (APPLIED)
Comparison: 09/15/2017 and CT urogram 04/25/2019

CLINICAL DATA: Follow-up thoracic aortic aneurysm.

EXAM:
CT ANGIOGRAPHY CHEST WITH CONTRAST
TECHNIQUE: Multidetector CT imaging of the chest was performed using the
standard protocol during bolus administration of intravenous
contrast. Multiplanar CT image reconstructions and MIPs were
obtained to evaluate the vascular anatomy.
CONTRAST:  75mL NIVW7U-18R IOPAMIDOL (NIVW7U-18R) INJECTION 76%

[Series 4: chest angio · axial · 0.88mm/px · z∈[-59,+214]mm · 11 of 111 slices shown]
[im 10/111  lung]
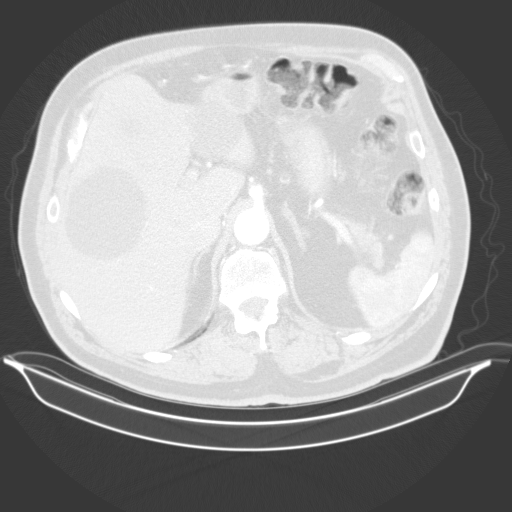
[im 19/111  soft-tissue]
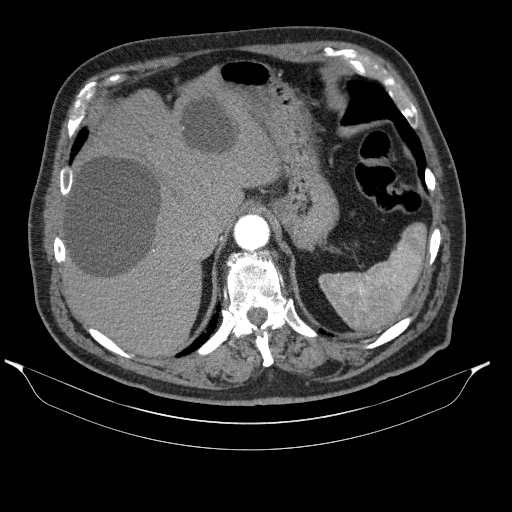
[im 28/111  lung]
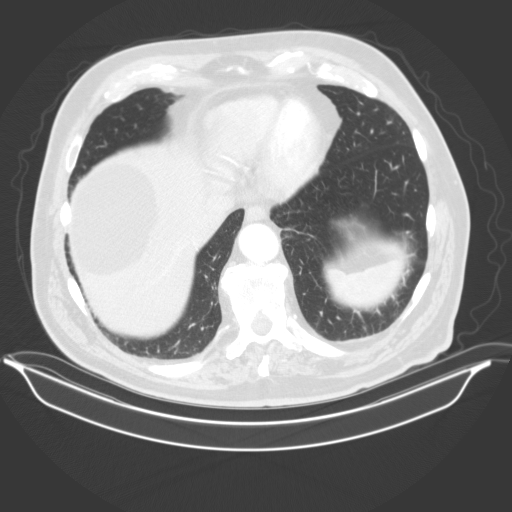
[im 37/111  soft-tissue]
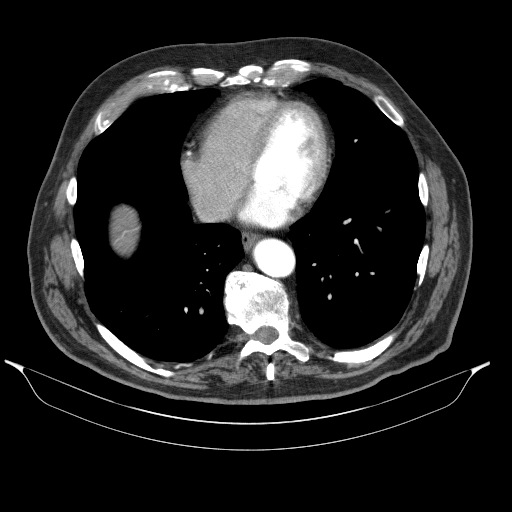
[im 46/111  lung]
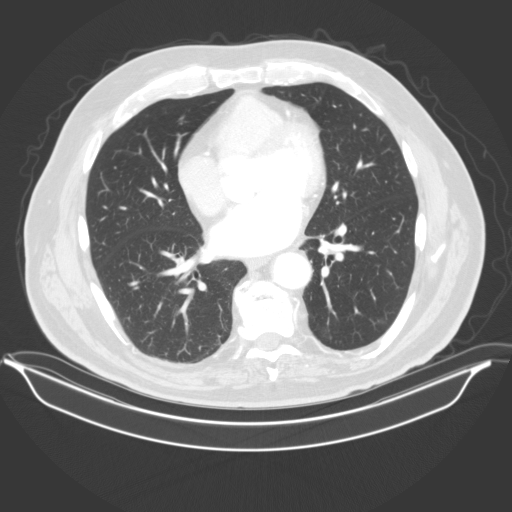
[im 56/111  soft-tissue]
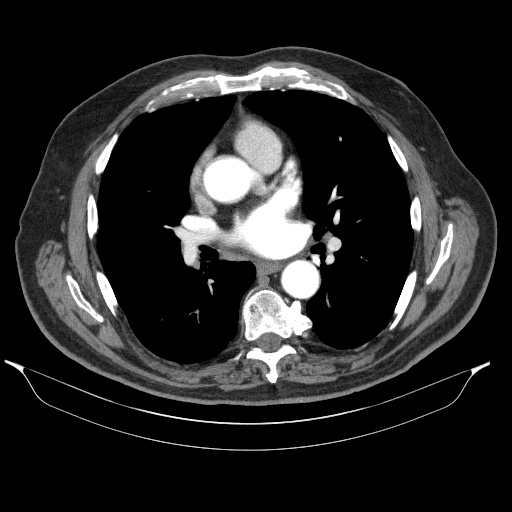
[im 65/111  lung]
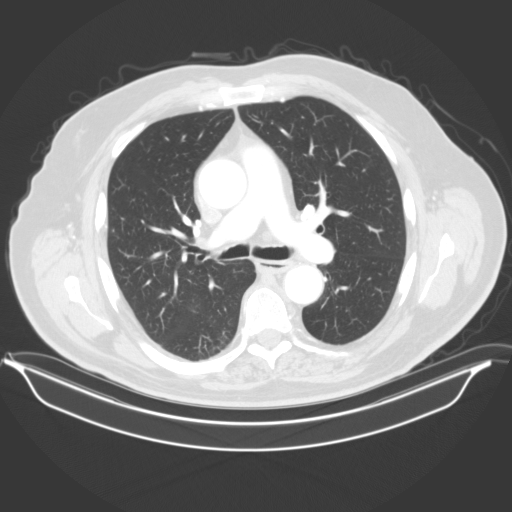
[im 74/111  soft-tissue]
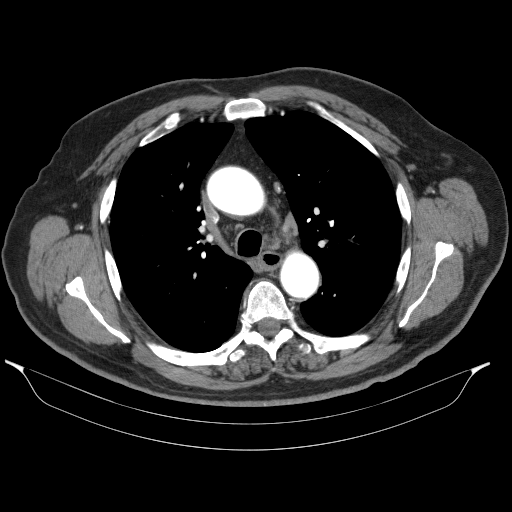
[im 83/111  lung]
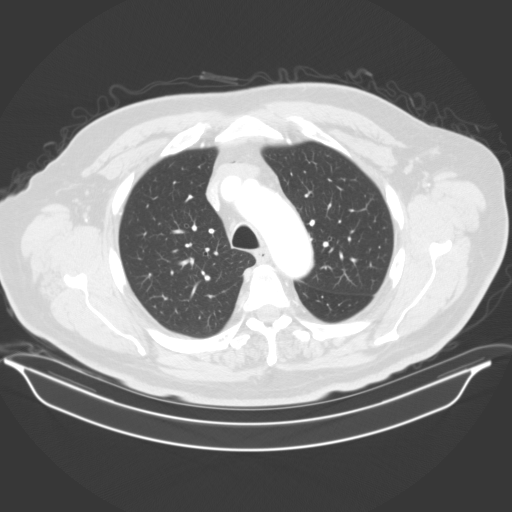
[im 92/111  soft-tissue]
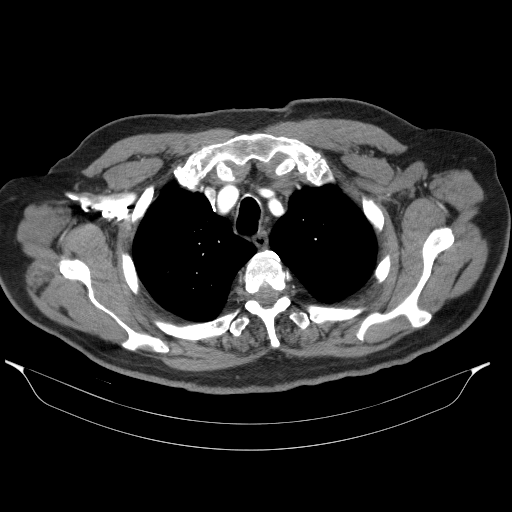
[im 101/111  lung]
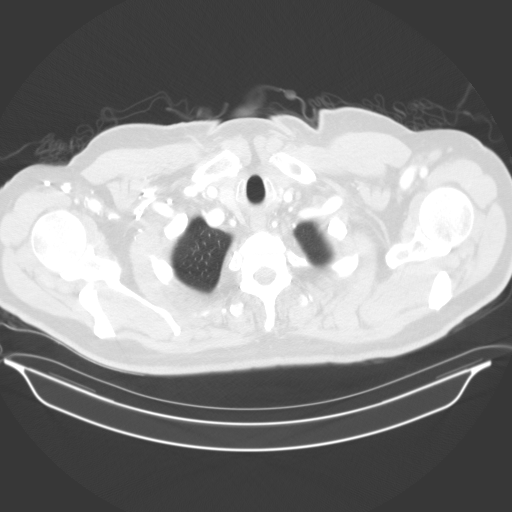

[Series 11: lung · axial · 0.88mm/px · z∈[-46,+170]mm · 7 of 164 slices shown]
[im 19/164  soft-tissue]
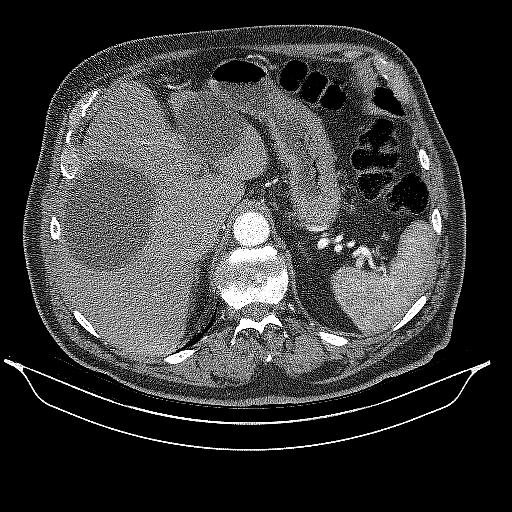
[im 37/164  soft-tissue]
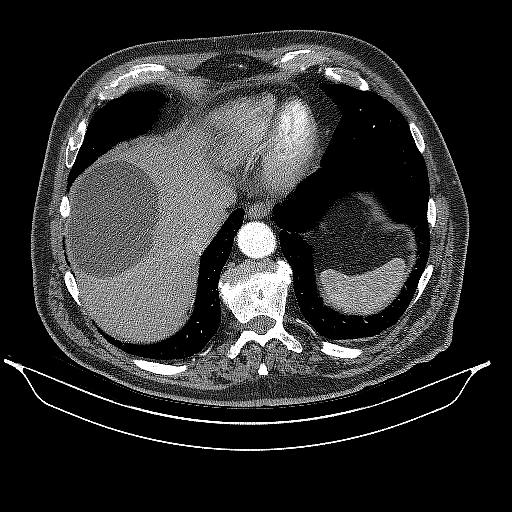
[im 55/164  soft-tissue]
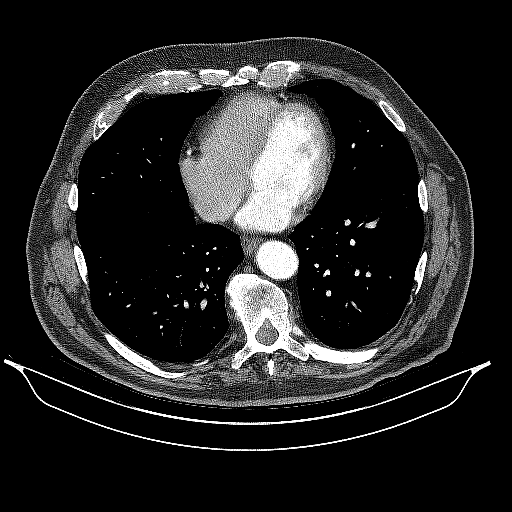
[im 73/164  soft-tissue]
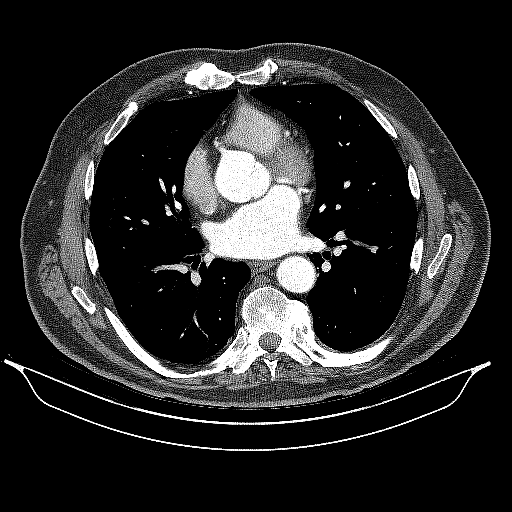
[im 91/164  soft-tissue]
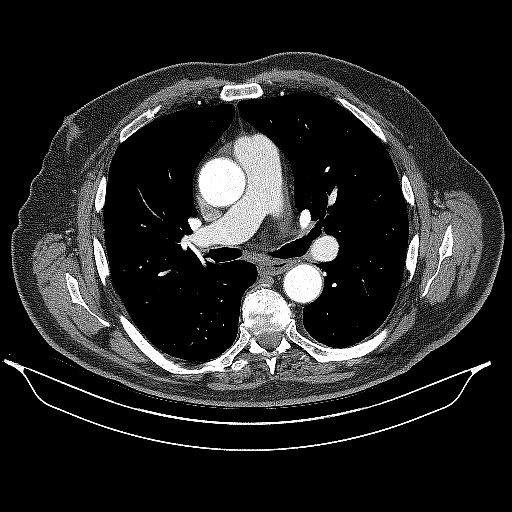
[im 109/164  soft-tissue]
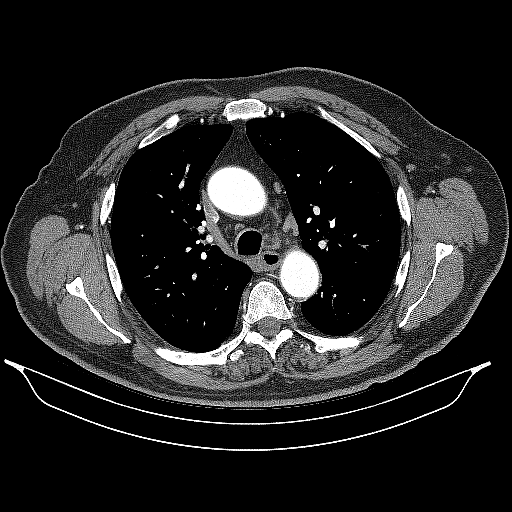
[im 127/164  soft-tissue]
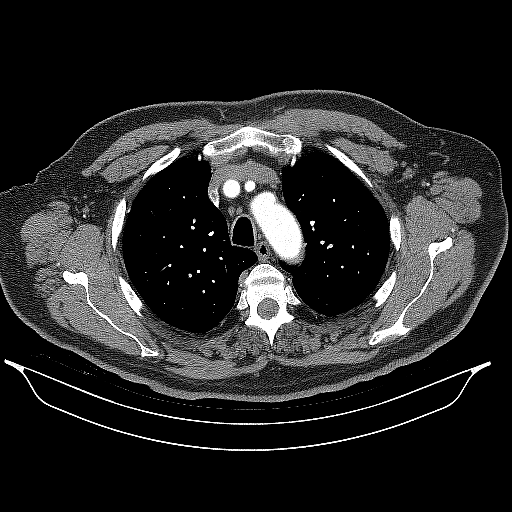

[18 of 32 positions shown; findings below may reference images not displayed]

FINDINGS: Cardiovascular: Aortic root measures up to 4.6 cm and stable.
Ascending thoracic aorta is stable measuring up to 4.1 cm. Negative
for an aortic dissection. Typical three-vessel arch anatomy. Great
vessels are patent. Proximal descending thoracic aorta measures
cm and stable. Distal descending thoracic aorta measures 3.1 cm and
stable. The celiac trunk is dilated and measures up to 1.4 cm and
stable. Again noted is focal dissection involving the distal celiac
artery trunk that appears stable. There is flow in the left gastric
artery, splenic artery and common hepatic artery. SMA origin is
patent. Main pulmonary arteries are patent.

Mediastinum/Nodes: Small lymph nodes in the mediastinum are stable.
No suspicious lymph node enlargement. Stable appearance of the
thyroid tissue. No axillary lymph node enlargement. Esophagus is
unremarkable.

Lungs/Pleura: Trachea and mainstem bronchi are patent. No large
pleural effusions. Thickening or focal nodularity along the left
major fissure on sequence 11, image 79 is unchanged and compatible
with a benign finding. New poorly defined nodular density in the
left lower lobe on sequence 11, image 100. This small nodular
density measures up to 6 mm. No significant airspace disease or
consolidation in the lungs.

Upper Abdomen: Again noted are large hepatic cysts. A right hepatic
cyst measures up to 11.1 cm. Again noted is a large calcified
gallstone at the base of the gallbladder that measures up to 2.2 cm.
Again noted is distention of the gallbladder which is incompletely
visualized. No acute abnormality in the upper abdomen.

Musculoskeletal: No acute bone abnormality.

Review of the MIP images confirms the above findings.
IMPRESSION: 1. Stable aneurysmal dilatation of the aortic root and ascending
thoracic aorta. The aortic root measures approximately 4.6 cm. The
ascending thoracic aorta measures 4.1 cm. Aortic aneurysm NOS
([id number]).
2. New nodular density in the left lower lobe measuring up to 6 mm.
Non-contrast chest CT at 3-6 months is recommended. If the nodules
are stable at time of repeat CT, then future CT at 18-24 months
(from today's scan) is considered optional for low-risk patients,
but is recommended for high-risk patients. This recommendation
follows the consensus statement: Guidelines for Management of
Incidental Pulmonary Nodules Detected on CT Images: From the
3. Stable aneurysm of the celiac trunk measuring up to 1.4 cm with a
focal dissection.
4. Large gallstone at the base of the gallbladder with evidence of
chronic gallbladder distension. Gallbladder is not completely
evaluated on this examination.
5. Hepatic cysts.

## 2021-08-30 ENCOUNTER — Encounter: Payer: Self-pay | Admitting: Cardiology

## 2021-08-30 ENCOUNTER — Ambulatory Visit: Payer: Medicare Other | Admitting: Cardiology

## 2021-08-30 VITALS — BP 107/69 | HR 74 | Temp 98.0°F | Resp 17 | Ht 74.0 in | Wt 268.0 lb

## 2021-08-30 DIAGNOSIS — I4821 Permanent atrial fibrillation: Secondary | ICD-10-CM

## 2021-08-30 DIAGNOSIS — I7121 Aneurysm of the ascending aorta, without rupture: Secondary | ICD-10-CM | POA: Insufficient documentation

## 2021-08-30 NOTE — Progress Notes (Signed)
Primary Physician/Referring:  Linus Mako, NP  Patient ID: Christopher Goodwin, male    DOB: [birth year], 76 y.o.   MRN: [id number]  Chief Complaint  Patient presents with   Thoracic Aortic Aneurysm   Atrial Fibrillation   Follow-up    1 year   HPI:    Christopher Goodwin  is a 76 y.o. male  with permanent atrial fibrillation, ascending aortic aneurysm, mild hyperlipidemia and angina pectoris.   He moved here from [place], [place]. Has moderate thoracic aortic aneurysm at 4.7 cm that is stable by CT scan in 2019. Has been stable since 2016 and correlates well with echocardiogram. No Abdominal aortic aneurysm by CT in 2016 and 2017.   Due to exertional chest pain suggestive of angina pectoris he had undergone nuclear stress test on 10/04/2018 which had revealed inferior wall mild ischemia and EF 41%, echocardiogram and revealed EF 45-50% in June 2020.  Since being on statins and aggressive medical therapy, he has not had any recurrence of angina pectoris.  He now presents for 1 year follow-up of angina and also AAA, essentially asymptomatic.    Past Medical History:  Diagnosis Date   Anticoagulated on Coumadin    Arthritis    BPH (benign prostatic hyperplasia)    Cardiomyopathy, dilated, nonischemic ([place])    by 2008 cath and 2013 stress test results; as of 12/28/14: has been followed by Dr. Bernardo Heater in [place] (last seen 11/2014), moved to [facility] 12/2014   Chronic atrial fibrillation [facility])    cardiologist-  dr Einar Gip---  per pt lov 10/ 2018   Dilated aortic root ([place])    4.9 cm by 05/26/14 echo [facility], Dr. Donnal Debar)   Dysrhythmia    Foley catheter in place    Hyperlipidemia    Permanent atrial fibrillation ([place]) 09/01/2018   PONV (postoperative nausea and vomiting)    Thoracic ascending aortic aneurysm ([place])    per CT 08-10-2015  4.0cm   Urinary retention    Wears glasses    Past Surgical History:  Procedure Laterality Date   ANTERIOR CERVICAL  DECOMPRESSION/DISCECTOMY FUSION 4 LEVELS N/A 11/02/2020   Procedure: Anterior Cervical Decompression Fusion - Cervical three-Cervical four - Cervical four-Cervical five - Cervical five-Cervical six - Cervical six-Cervical seven;  Surgeon: Kary Kos, MD;  Location: [place];  Service: Neurosurgery;  Laterality: N/A;   BACK SURGERY     CARDIAC CATHETERIZATION  04/10/2006  [place], [place]    [facility]): Mild plaquing w/o obstructive CAD, LVEF 40-45%, normal right heart pressures.    CARDIOVASCULAR STRESS TEST  01-09-2017   DR Einar Gip   Low risk myocardial perfusion study w/ no ischemia/  LV function 45% (but visually appears normal)   CYSTOSCOPY N/A 01/30/2017   Procedure: CYSTOSCOPY;  Surgeon: Bjorn Loser, MD;  Location: [facility];  Service: Urology;  Laterality: N/A;   JOINT REPLACEMENT     LUMBAR LAMINECTOMY/DECOMPRESSION MICRODISCECTOMY Left 01/03/2015   Procedure: Lumbar four, lumbar five left Laminectomy and Foraminotomy ;  Surgeon: Kary Kos, MD;  Location: [place] [facility];  Service: Neurosurgery;  Laterality: Left;   TOTAL KNEE ARTHROPLASTY Bilateral 2007;  2003   TRANSTHORACIC ECHOCARDIOGRAM  09-04-2016   DR Einar Gip   moderate LVH, ef 45%, mild generalized hypokinesis/ due to Afib unable to evaluate diastolic function/  mild LAE/  trivial MR & TR/  mild PR /  moderate dilated aortic root 4.8cm , mild atherosclerotic aorta/  IVC dilated with respiratory variation  TRANSURETHRAL RESECTION OF PROSTATE N/A 01/30/2017   Procedure: TRANSURETHRAL RESECTION OF THE PROSTATE (TURP);  Surgeon: Bjorn Loser, MD;  Location: [facility];  Service: Urology;  Laterality: N/A;   Family History  Problem Relation Age of Onset   Hypertension Mother    Diabetes Brother     Social History   Tobacco Use   Smoking status: Former    Types: Cigars    Quit date: 01/24/2015    Years since quitting: 6.6   Smokeless tobacco: Never  Substance Use Topics   Alcohol  use: Yes    Alcohol/week: 2.0 standard drinks    Types: 2 Cans of beer per week    Comment: beer occ   Marital Status: Married  ROS  Review of Systems  Cardiovascular:  Negative for dyspnea on exertion, leg swelling and syncope.  Respiratory:  Negative for shortness of breath.   Gastrointestinal:  Negative for melena.  Objective  There were no vitals taken for this visit.     11/03/2020    7:26 AM 11/03/2020    3:49 AM 11/03/2020   12:06 AM  Vitals with BMI  Systolic 952 841 99  Diastolic 80 84 63  Pulse 73 64 63     Physical Exam Constitutional:      General: He is not in acute distress.    Appearance: He is well-developed.  Cardiovascular:     Rate and Rhythm: Normal rate. Rhythm irregularly irregular.     Pulses: Intact distal pulses.     Heart sounds: No murmur heard.   No gallop.     Comments: No leg edema, no JVD.  Pulmonary:     Effort: Pulmonary effort is normal. No accessory muscle usage.     Breath sounds: Normal breath sounds.  Abdominal:     General: Bowel sounds are normal.     Palpations: Abdomen is soft.   Laboratory examination:   Recent Labs    10/30/20 1056  NA 136  K 4.3  CL 104  CO2 26  GLUCOSE 101*  BUN 17  CREATININE 1.22  CALCIUM 8.9  GFRNONAA >60   CrCl cannot be calculated (Patient's most recent lab result is older than the maximum 21 days allowed.).     Latest Ref Rng & Units 10/30/2020   10:56 AM 09/01/2019    3:40 PM 07/28/2019   11:29 AM  CMP  Glucose 70 - 99 mg/dL 101   92   102    BUN 8 - 23 mg/dL '17   11   13    ' Creatinine 0.61 - 1.24 mg/dL 1.22   1.06   1.06    Sodium 135 - 145 mmol/L 136   141   142    Potassium 3.5 - 5.1 mmol/L 4.3   3.3   3.5    Chloride 98 - 111 mmol/L 104   101   107    CO2 22 - 32 mmol/L '26   24   27    ' Calcium 8.9 - 10.3 mg/dL 8.9   8.4   8.7    Total Protein 6.0 - 8.3 g/dL   7.1    Total Bilirubin 0.2 - 1.2 mg/dL   0.9    Alkaline Phos 39 - 117 U/L   82    AST 0 - 37 U/L   23    ALT 0 - 53  U/L   32        Latest Ref Rng & Units  10/30/2020   10:56 AM 07/28/2019   11:29 AM 12/25/2018    1:34 PM  CBC  WBC 4.0 - 10.5 K/uL 7.0   7.3   9.5    Hemoglobin 13.0 - 17.0 g/dL 17.2   15.2   15.8    Hematocrit 39.0 - 52.0 % 51.9   45.7   48.3    Platelets 150 - 400 K/uL 204   192.0   175      External labs:   Labs 07/04/2020:  Total cholesterol 121, triglycerides 97, HDL 43, LDL 59.  Non-HDL cholesterol 78.  Sodium 139, potassium 4.2, BUN 20, creatinine 1.27, EGFR 59 mL.  CMP otherwise normal.  A1c 5.5%.  Vitamin D 32, vitamin B12 619.  Medications and allergies  No Known Allergies   Current Outpatient Medications:    atenolol (TENORMIN) 50 MG tablet, Take 50 mg by mouth every evening. , Disp: , Rfl:    atorvastatin (LIPITOR) 10 MG tablet, TAKE ONE-HALF TABLET BY MOUTH ONCE DAILYAS DIRECTED, Disp: 90 tablet, Rfl: 3   Cholecalciferol (VITAMIN D) 2000 UNITS CAPS, Take 2,000 Units every evening by mouth. , Disp: , Rfl:    cyanocobalamin (,VITAMIN B-12,) 1000 MCG/ML injection, Inject 1,000 mcg into the muscle every 30 (thirty) days., Disp: , Rfl:    folic acid (FOLVITE) 1 MG tablet, Take 1 mg every evening by mouth. , Disp: , Rfl:    nitrofurantoin (MACRODANTIN) 100 MG capsule, Take 100 mg by mouth daily., Disp: , Rfl:    nitroGLYCERIN (NITROSTAT) 0.4 MG SL tablet, Place 0.4 mg under the tongue every 5 (five) minutes as needed for chest pain., Disp: , Rfl:    tamsulosin (FLOMAX) 0.4 MG CAPS capsule, Take 0.4 mg every evening by mouth., Disp: , Rfl: 10   vitamin C (ASCORBIC ACID) 500 MG tablet, Take 500 mg by mouth daily., Disp: , Rfl:    warfarin (COUMADIN) 6 MG tablet, Take 1 tablet (6 mg total) by mouth every evening., Disp: 30 tablet, Rfl: 0   zinc gluconate 50 MG tablet, Take 50 mg by mouth daily., Disp: , Rfl:   Radiology:   CTA abdomen 08/10/2015: Aorta: Normal caliber abdominal aorta. No significant atherosclerotic vascular calcifications. No evidence of dissection or other  acute abnormality.  CT angio chest for follow-up of thoracic aortic aneurysm 09/16/2019: 1. Stable aneurysmal dilatation of the aortic root and ascending thoracic aorta. The aortic root measures approximately 4.6 cm. The ascending thoracic aorta measures 4.1 cm.  2. New nodular density in the left lower lobe measuring up to 6 mm. Non-contrast chest CT at 3-6 months is recommended. If the nodules are stable at time of repeat CT, then future CT at 18-24 months (from today's scan) is considered optional for low-risk patients, but is recommended for high-risk patients 3. 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 4. Large gallstone at the base of the gallbladder with evidence of chronic gallbladder distension. Gallbladder is not completely evaluated on this examination. 5. Hepatic cysts. No significant change from 06/06/2014, 08/10/2015 and 09/15/2017.  Cardiac Studies:   Lexiscan Myoview Stress Test 10/04/2018: Resting EKG demonstrates atrial fibrillation with rapid ventricular response, leftward axis, incomplete right bundle branch block, PVC.  Stress EKG is nondiagnostic for ischemia as a pharmacologic stress test. There is a moderate-sized mild inferior wall ischemia extending from the base towards the apex in the distribution of right coronary artery.  Left ventricular systolic function calculated by QGS was  41% with inferior hypokinesis. High risk study.  Echocardiogram 07/14/2019:  Left ventricle cavity is normal in size. Mild concentric hypertrophy of the left ventricle. Abnormal septal wall motion due to left bundle branch block. Mildly depressed LV systolic function with visual EF 45-50%. Unable to evaluate diastolic function due to atrial fibrillation.  Left atrial cavity is severely dilated.  Trileaflet aortic valve.  Trace aortic regurgitation. Mild (Grade I) mitral regurgitation.  Ascending aorta aneurysm, proximal aorta measuring 4.9 cm. Previous  measurement in 09/17/2018 was 4.7 cm.  Incidental finding of 5 cm hypoechoic liver cyst.   Exercise treadmill stress test 10/05/2019:  Exercise treadmill stress test performed using Bruce protocol.  Patient reached 8.5 METS, and 129% of age predicted maximum heart rate.  Exercise capacity was low.  No chest pain reported.  Normal heart rate and blood pressure response. Stress ECG demonstrated atrial fibrillation with rapid ventricular response, frequent PVC's, no ST-T wave abnormalities.  Low risk study.    EKG EKG 08/30/2021: Atrial fibrillation with controlled ventricular response at rate of 67 bpm, leftward axis, nonspecific T abnormality.  No significant change from 08/31/2020.   Assessment     ICD-10-CM   1. Permanent atrial fibrillation (HCC)  I48.21 EKG 12-Lead       No orders of the defined types were placed in this encounter.   Medications Discontinued During This Encounter  Medication Reason   cyclobenzaprine (FLEXERIL) 10 MG tablet    oxyCODONE (OXY IR/ROXICODONE) 5 MG immediate release tablet     Recommendations:   DENIM KALMBACH  is a 76 y.o. male  with permanent atrial fibrillation, ascending aortic aneurysm, mild hyperlipidemia and stable angina pectoris with a nuclear stress test revealing mild inferior ischemia in August 2020. On aggressive medical therapy has not had recurrence of angina.  He presents for 1 year follow up, denies any new symptoms, tolerating all his medications well.  It has been 2 years since his last CT, we have been doing his CT scan every other year although guidelines directed to be every year as it has been stable over the past 6 to 8 years.  He is aware of aneurysm expansion chest pain.  I will order a CT scan of the chest for follow-up.  With regard to atrial fibrillation, he remains rate controlled, warfarin is being managed by his PCP.  Blood pressure is well controlled, no changes in the medications were done today.  I will follow him up in  a year or sooner if problems.       Adrian Prows, MD, [facility] 08/30/2021, 10:38 AM Office: [phone] Fax: [phone] Pager: [phone]

## 2021-08-31 LAB — CBC
Hematocrit: 50 % (ref 37.5–51.0)
Hemoglobin: 17.2 g/dL (ref 13.0–17.7)
MCH: 30.4 pg (ref 26.6–33.0)
MCHC: 34.4 g/dL (ref 31.5–35.7)
MCV: 88 fL (ref 79–97)
Platelets: 198 10*3/uL (ref 150–450)
RBC: 5.66 x10E6/uL (ref 4.14–5.80)
RDW: 12.7 % (ref 11.6–15.4)
WBC: 8.7 10*3/uL (ref 3.4–10.8)

## 2021-08-31 LAB — BASIC METABOLIC PANEL
BUN/Creatinine Ratio: 16 (ref 10–24)
BUN: 18 mg/dL (ref 8–27)
CO2: 21 mmol/L (ref 20–29)
Calcium: 9 mg/dL (ref 8.6–10.2)
Chloride: 104 mmol/L (ref 96–106)
Creatinine, Ser: 1.15 mg/dL (ref 0.76–1.27)
Glucose: 94 mg/dL (ref 70–99)
Potassium: 4.3 mmol/L (ref 3.5–5.2)
Sodium: 141 mmol/L (ref 134–144)
eGFR: 66 mL/min/{1.73_m2} (ref >59)

## 2021-09-16 ENCOUNTER — Ambulatory Visit
Admission: RE | Admit: 2021-09-16 | Discharge: 2021-09-16 | Disposition: A | Discharge status: Home / Self Care | Payer: Medicare Other | Source: Ambulatory Visit | Attending: Cardiology | Admitting: Cardiology

## 2021-09-16 DIAGNOSIS — I7121 Aneurysm of the ascending aorta, without rupture: Secondary | ICD-10-CM

## 2021-09-16 MED ORDER — IOPAMIDOL (ISOVUE-370) INJECTION 76%
75.0000 mL | Freq: Once | INTRAVENOUS | Status: AC | PRN
  Administered 2021-09-16: 75 mL via INTRAVENOUS

## 2021-09-18 NOTE — Progress Notes (Signed)
CTA chest 09/16/2021 (Comparison 10/30/2020) :  There is fusiform aneurysm in the ascending thoracic aorta measuring 4.6 cm in the aortic root and 4.2 cm at the level of main pulmonary artery. This finding appears stable. There is no evidence of thoracic aortic dissection. There are no focal infiltrates. There is no pleural effusion or pneumothorax. Gallbladder stones.  Cysts are seen in the liver and left kidney

## 2021-09-26 NOTE — Progress Notes (Signed)
Called and spoke to pt, pt voiced understanding.

## 2022-01-24 ENCOUNTER — Other Ambulatory Visit: Payer: Self-pay | Admitting: Cardiology

## 2022-01-24 DIAGNOSIS — E785 Hyperlipidemia, unspecified: Secondary | ICD-10-CM

## 2022-07-08 ENCOUNTER — Other Ambulatory Visit: Payer: Self-pay | Admitting: Neurosurgery

## 2022-07-08 DIAGNOSIS — M5440 Lumbago with sciatica, unspecified side: Secondary | ICD-10-CM

## 2022-08-01 ENCOUNTER — Ambulatory Visit
Admission: RE | Admit: 2022-08-01 | Discharge: 2022-08-01 | Disposition: A | Discharge status: Home / Self Care | Payer: Medicare Other | Source: Ambulatory Visit | Attending: Neurosurgery | Admitting: Neurosurgery

## 2022-08-01 DIAGNOSIS — M5440 Lumbago with sciatica, unspecified side: Secondary | ICD-10-CM

## 2022-08-15 ENCOUNTER — Encounter: Payer: Self-pay | Admitting: Cardiology

## 2022-09-04 ENCOUNTER — Encounter: Payer: Self-pay | Admitting: Cardiology

## 2022-09-04 ENCOUNTER — Ambulatory Visit: Payer: Medicare Other | Admitting: Cardiology

## 2022-09-04 VITALS — BP 94/71 | HR 72 | Resp 16 | Ht 74.0 in | Wt 275.0 lb

## 2022-09-04 DIAGNOSIS — I4821 Permanent atrial fibrillation: Secondary | ICD-10-CM

## 2022-09-04 DIAGNOSIS — I7121 Aneurysm of the ascending aorta, without rupture: Secondary | ICD-10-CM

## 2022-09-04 NOTE — Progress Notes (Signed)
Primary Physician/Referring:  Vernon Prey, NP  Patient ID: Christopher Goodwin, male    DOB: February 28, 1946, 77 y.o.   MRN: [id number]  No chief complaint on file.  HPI:    Christopher Goodwin  is a 77 y.o. male  with permanent atrial fibrillation, ascending aortic aneurysm, mild hyperlipidemia and angina pectoris.   Has moderate thoracic aortic aneurysm at 4.7 cm that is stable by CT scan in 2019. Has been stable since 2016 and correlates well with echocardiogram. No Abdominal aortic aneurysm by CT in 2016 and 2017.   Due to exertional chest pain suggestive of angina pectoris he had undergone nuclear stress test on 10/04/2018 which had revealed inferior wall mild ischemia and EF 41%, echocardiogram and revealed EF 45-50% in June 2020.  Since being on statins and aggressive medical therapy, he has not had any recurrence of angina pectoris.  He now presents for 1 year follow-up of angina and also AAA, essentially asymptomatic.    Past Medical History:  Diagnosis Date   Anticoagulated on Coumadin    Arthritis    BPH (benign prostatic hyperplasia)    Cardiomyopathy, dilated, nonischemic ([facility])    by 2008 cath and 2013 stress test results; as of 12/28/14: has been followed by Dr. Loura Halt in [place] (last seen 11/2014), moved to [facility] 12/2014   Chronic atrial fibrillation [facility])    cardiologist-  dr Jacinto Halim---  per pt lov 10/ 2018   Dilated aortic root (HCC)    4.9 cm by 05/26/14 echo [facility], Dr. Rolene Arbour)   Dysrhythmia    Foley catheter in place    Hyperlipidemia    Permanent atrial fibrillation ([facility]) 09/01/2018   PONV (postoperative nausea and vomiting)    Thoracic ascending aortic aneurysm ([facility])    per CT 08-10-2015  4.0cm   Urinary retention    Wears glasses    Past Surgical History:  Procedure Laterality Date   ANTERIOR CERVICAL DECOMPRESSION/DISCECTOMY FUSION 4 LEVELS N/A 11/02/2020   Procedure: Anterior Cervical Decompression Fusion - Cervical  three-Cervical four - Cervical four-Cervical five - Cervical five-Cervical six - Cervical six-Cervical seven;  Surgeon: Donalee Citrin, MD;  Location: [facility];  Service: Neurosurgery;  Laterality: N/A;   BACK SURGERY     CARDIAC CATHETERIZATION  04/10/2006  [place], [place]    [facility]): Mild plaquing w/o obstructive CAD, LVEF 40-45%, normal right heart pressures.    CARDIOVASCULAR STRESS TEST  01-09-2017   DR Jacinto Halim   Low risk myocardial perfusion study w/ no ischemia/  LV function 45% (but visually appears normal)   CYSTOSCOPY N/A 01/30/2017   Procedure: CYSTOSCOPY;  Surgeon: Alfredo Martinez, MD;  Location: [facility];  Service: Urology;  Laterality: N/A;   JOINT REPLACEMENT     LUMBAR LAMINECTOMY/DECOMPRESSION MICRODISCECTOMY Left 01/03/2015   Procedure: Lumbar four, lumbar five left Laminectomy and Foraminotomy ;  Surgeon: Donalee Citrin, MD;  Location: [facility];  Service: Neurosurgery;  Laterality: Left;   TOTAL KNEE ARTHROPLASTY Bilateral 2007;  2003   TRANSTHORACIC ECHOCARDIOGRAM  09-04-2016   DR Jacinto Halim   moderate LVH, ef 45%, mild generalized hypokinesis/ due to Afib unable to evaluate diastolic function/  mild LAE/  trivial MR & TR/  mild PR /  moderate dilated aortic root 4.8cm , mild atherosclerotic aorta/  IVC dilated with respiratory variation   TRANSURETHRAL RESECTION OF PROSTATE N/A 01/30/2017   Procedure: TRANSURETHRAL RESECTION OF THE PROSTATE (TURP);  Surgeon: Alfredo Martinez, MD;  [facility]: [facility];  Service: Urology;  Laterality: N/A;   Family History  Problem Relation Age of Onset   Hypertension Mother    Diabetes Brother     Social History   Tobacco Use   Smoking status: Former    Types: Cigars    Quit date: 01/24/2015    Years since quitting: 7.6   Smokeless tobacco: Never  Substance Use Topics   Alcohol use: Yes    Alcohol/week: 2.0 standard drinks of alcohol    Types: 2 Cans of beer per week    Comment: beer occ    Marital Status: Married  ROS  Review of Systems  Cardiovascular:  Negative for dyspnea on exertion, leg swelling and syncope.  Respiratory:  Negative for shortness of breath.   Gastrointestinal:  Negative for melena.   Objective  Blood pressure 94/71, pulse 72, resp. rate 16, height 6\' 2"  (1.88 m), weight 275 lb (124.7 kg), SpO2 94 %.     09/04/2022   10:15 AM 08/30/2021   10:40 AM 11/03/2020    7:26 AM  Vitals with BMI  Height 6\' 2"  6\' 2"    Weight 275 lbs 268 lbs   BMI 35.29 34.39   Systolic 94 107 108  Diastolic 71 69 80  Pulse 72 74 73     Physical Exam Cardiovascular:     Rate and Rhythm: Normal rate. Rhythm irregularly irregular.     Pulses: Normal pulses and intact distal pulses.     Heart sounds: No murmur heard.    No gallop.  Pulmonary:     Effort: Pulmonary effort is normal. No accessory muscle usage.     Breath sounds: Normal breath sounds.  Abdominal:     General: Bowel sounds are normal.     Palpations: Abdomen is soft.  Musculoskeletal:     Right lower leg: No edema.     Left lower leg: No edema.    Laboratory examination:      Latest Ref Rng & Units 08/30/2021   11:28 AM 10/30/2020   10:56 AM 09/01/2019    3:40 PM  CMP  Glucose 70 - 99 mg/dL 94  161  92   BUN 8 - 27 mg/dL 18  17  11    Creatinine 0.76 - 1.27 mg/dL 0.96  0.45  4.09   Sodium 134 - 144 mmol/L 141  136  141   Potassium 3.5 - 5.2 mmol/L 4.3  4.3  3.3   Chloride 96 - 106 mmol/L 104  104  101   CO2 20 - 29 mmol/L 21  26  24    Calcium 8.6 - 10.2 mg/dL 9.0  8.9  8.4       Latest Ref Rng & Units 08/30/2021   11:28 AM 10/30/2020   10:56 AM 07/28/2019   11:29 AM  CBC  WBC 3.4 - 10.8 x10E3/uL 8.7  7.0  7.3   Hemoglobin 13.0 - 17.7 g/dL 81.1  91.4  78.2   Hematocrit 37.5 - 51.0 % 50.0  51.9  45.7   Platelets 150 - 450 x10E3/uL 198  204  192.0     External labs:   Labs 07/04/2020:  Total cholesterol 121, triglycerides 97, HDL 43, LDL 59.  Non-HDL cholesterol 78.  Sodium 139,  potassium 4.2, BUN 20, creatinine 1.27, EGFR 59 mL.  CMP otherwise normal.  A1c 5.5%.  Vitamin D 32, vitamin B12 619.  Radiology:   CTA abdomen 08/10/2015: Aorta: Normal caliber abdominal aorta. No significant atherosclerotic vascular calcifications. No  evidence of dissection or other acute abnormality.  CTA chest 09/16/2021 (Comparison 10/30/2020) :  There is fusiform aneurysm in the ascending thoracic aorta measuring 4.6 cm in the aortic root and 4.2 cm at the level of main pulmonary artery. This finding appears stable from 06/06/2014, 08/10/2015 and 09/15/2017, 09/15/2017. There is no evidence of thoracic aortic dissection. There are no focal infiltrates. There is no pleural effusion or pneumothorax. Gallbladder stones.  Cysts are seen in the liver and left kidney  Cardiac Studies:   Lexiscan Myoview Stress Test 10/04/2018: Resting EKG demonstrates atrial fibrillation with rapid ventricular response, leftward axis, incomplete right bundle branch block, PVC.  Stress EKG is nondiagnostic for ischemia as a pharmacologic stress test. There is a moderate-sized mild inferior wall ischemia extending from the base towards the apex in the distribution of right coronary artery.  Left ventricular systolic function calculated by QGS was 41% with inferior hypokinesis. High risk study.  Exercise treadmill stress test 10/05/2019:  Exercise treadmill stress test performed using Bruce protocol.  Patient reached 8.5 METS, and 129% of age predicted maximum heart rate.  Exercise capacity was low.  No chest pain reported.  Normal heart rate and blood pressure response. Stress ECG demonstrated atrial fibrillation with rapid ventricular response, frequent PVC's, no ST-T wave abnormalities.  Low risk study.   PCV ECHOCARDIOGRAM COMPLETE 09/12/2020  Narrative Echocardiogram 09/12/2020: Left ventricle cavity is normal in size and wall thickness. Normal global wall motion. Normal LV systolic function with EF 55%.  Indeterminate diastolic filling pattern. The aortic root is mildly dilated. Prox ascending aorta mea, trace AI, mild MR. ures 4.3 cm. No significant valvular abnormality. No evidence of pulmonary hypertension.Previous study in 07/2019 noted severe LA dilatation, prox ascending aorta diameter 4.9 cm.    EKG  EKG 09/04/2022: Atrial fibrillation with controlled ventricular response at rate of 78 bpm, normal axis, no evidence of ischemia.  Compared to 08/30/2021, no significant change.    Medications and allergies  No Known Allergies   Current Outpatient Medications:    atenolol (TENORMIN) 50 MG tablet, Take 50 mg by mouth every evening. , Disp: , Rfl:    atorvastatin (LIPITOR) 10 MG tablet, TAKE ONE-HALF TABLET BY MOUTH ONCE DAILYAS DIRECTED, Disp: 90 tablet, Rfl: 3   Cholecalciferol (VITAMIN D) 2000 UNITS CAPS, Take 2,000 Units every evening by mouth. , Disp: , Rfl:    cyanocobalamin (,VITAMIN B-12,) 1000 MCG/ML injection, Inject 1,000 mcg into the muscle every 30 (thirty) days., Disp: , Rfl:    folic acid (FOLVITE) 1 MG tablet, Take 1 mg every evening by mouth. , Disp: , Rfl:    nitroGLYCERIN (NITROSTAT) 0.4 MG SL tablet, Place 0.4 mg under the tongue every 5 (five) minutes as needed for chest pain., Disp: , Rfl:    tamsulosin (FLOMAX) 0.4 MG CAPS capsule, Take 0.4 mg every evening by mouth., Disp: , Rfl: 10   vitamin C (ASCORBIC ACID) 500 MG tablet, Take 500 mg by mouth daily., Disp: , Rfl:    warfarin (COUMADIN) 6 MG tablet, Take 1 tablet (6 mg total) by mouth every evening., Disp: 30 tablet, Rfl: 0   zinc gluconate 50 MG tablet, Take 50 mg by mouth daily., Disp: , Rfl:    Assessment     ICD-10-CM   1. Permanent atrial fibrillation (HCC)  I48.21 EKG 12-Lead    PCV ECHOCARDIOGRAM COMPLETE    2. Aneurysm of ascending aorta without rupture (HCC)  I71.21 PCV ECHOCARDIOGRAM COMPLETE       No  orders of the defined types were placed in this encounter.   There are no discontinued  medications.   Recommendations:   Christopher Goodwin  is a 77 y.o. male  with permanent atrial fibrillation, ascending aortic aneurysm, mild hyperlipidemia and stable angina pectoris with a nuclear stress test revealing mild inferior ischemia in August 2020. On aggressive medical therapy has not had recurrence of angina.  1. Permanent atrial fibrillation [facility]) Patient has permanent atrial fibrillation, is tolerating anticoagulation well, INR being managed by his PCP which is established recently.  She will be faxing [place] the labs. - EKG 12-Lead - PCV ECHOCARDIOGRAM COMPLETE; Future  2. Aneurysm of ascending aorta without rupture [facility]) I reviewed his CT angiogram that was performed, fortunately CTA has remained stable since 2019.  I will continue to monitor his aortic aneurysm by annual echocardiogram and try to avoid CT scans if possible.  - PCV ECHOCARDIOGRAM COMPLETE; Future  He continues to be active, back pain continues to be a major issue limiting his activity, he is moderately obese, I have discussed with him regarding the importance of regular exercise.  Advised him that water aerobics will be the best exercise for him in view of back pain, he loves swimming.  Advised him that he should do this on a daily basis as they have been indoor pool.  His wife is also present and all questions were answered today.  I will see him back in a year or sooner if problems, echocardiogram prior to his next office visit.     Yates Decamp, MD, [facility] 09/04/2022, 10:59 AM Office: [phone] Fax: [phone] Pager: [phone]

## 2022-12-03 ENCOUNTER — Encounter: Payer: Self-pay | Admitting: Cardiology

## 2022-12-03 NOTE — Progress Notes (Signed)
Labs 09/30/2022:  Hb 17.8/HCT 54.7, platelets 183, normal indicis.  Serum glucose 94 mg, BUN 17, creatinine 1.16, EGFR 65 mL, LFTs normal.  Total cholesterol 143, triglycerides 132, HDL 46, LDL 74.

## 2023-02-26 ENCOUNTER — Other Ambulatory Visit: Payer: Self-pay | Admitting: Cardiology

## 2023-02-26 DIAGNOSIS — E785 Hyperlipidemia, unspecified: Secondary | ICD-10-CM

## 2023-08-20 ENCOUNTER — Other Ambulatory Visit: Payer: Medicare Other

## 2023-08-20 ENCOUNTER — Other Ambulatory Visit (HOSPITAL_COMMUNITY): Payer: Medicare Other

## 2023-08-24 ENCOUNTER — Ambulatory Visit (HOSPITAL_COMMUNITY)
Admission: RE | Admit: 2023-08-24 | Discharge: 2023-08-24 | Disposition: A | Discharge status: Home / Self Care | Source: Ambulatory Visit | Attending: Cardiology | Admitting: Cardiology

## 2023-08-24 DIAGNOSIS — I4821 Permanent atrial fibrillation: Secondary | ICD-10-CM | POA: Insufficient documentation

## 2023-08-24 DIAGNOSIS — I7121 Aneurysm of the ascending aorta, without rupture: Secondary | ICD-10-CM | POA: Insufficient documentation

## 2023-08-24 LAB — ECHOCARDIOGRAM COMPLETE
Area-P 1/2: 3.27 cm2
S' Lateral: 3.8 cm

## 2023-08-27 ENCOUNTER — Ambulatory Visit: Payer: Self-pay | Admitting: Cardiology

## 2023-08-27 ENCOUNTER — Ambulatory Visit: Payer: Medicare Other | Admitting: Cardiology

## 2023-08-27 NOTE — Progress Notes (Signed)
 Images personally reviewed. LVEF 40-45% and endocardium not well visualized. Stable echo and stable ascending aortic aneurysm. Will recheck in 1 year with contrast

## 2023-09-02 ENCOUNTER — Other Ambulatory Visit: Payer: Self-pay | Admitting: Cardiology

## 2023-09-02 DIAGNOSIS — E785 Hyperlipidemia, unspecified: Secondary | ICD-10-CM

## 2023-09-03 ENCOUNTER — Ambulatory Visit: Attending: Cardiology | Admitting: Cardiology

## 2023-09-03 ENCOUNTER — Encounter: Payer: Self-pay | Admitting: Cardiology

## 2023-09-03 VITALS — BP 110/68 | HR 70 | Ht 74.0 in | Wt 275.0 lb

## 2023-09-03 DIAGNOSIS — I7121 Aneurysm of the ascending aorta, without rupture: Secondary | ICD-10-CM | POA: Diagnosis present

## 2023-09-03 DIAGNOSIS — I4821 Permanent atrial fibrillation: Secondary | ICD-10-CM | POA: Diagnosis present

## 2023-09-03 NOTE — Patient Instructions (Addendum)
 Medication Instructions:  Your physician recommends that you continue on your current medications as directed. Please refer to the Current Medication list given to you today.  *If you need a refill on your cardiac medications before your next appointment, please call your pharmacy*  Lab Work: Have lab work (BMP) checked at [facility] week or so prior to CT.  There is a [facility] on the first floor of our building If you have labs (blood work) drawn today and your tests are completely normal, you will receive your results only by: MyChart Message (if you have MyChart) OR A paper copy in the mail If you have any lab test that is abnormal or we need to change your treatment, we will call you to review the results.  Testing/Procedures: Non-Cardiac CT Angiography (CTA), is a special type of CT scan that uses a computer to produce multi-dimensional views of major blood vessels throughout the body. In CT angiography, a contrast material is injected through an IV to help visualize the blood vessels --to be done in one year  Follow-Up: At [facility], you and your health needs are our priority.  As part of our continuing mission to provide you with exceptional heart care, our providers are all part of one team.  This team includes your primary Cardiologist (physician) and Advanced Practice Providers or APPs (Physician Assistants and Nurse Practitioners) who all work together to provide you with the care you need, when you need it.  Your next appointment:   12 month(s)  Provider:   Knox Perl, MD    We recommend signing up for the patient portal called "MyChart".  Sign up information is provided on this After Visit Summary.  MyChart is used to connect with patients for Virtual Visits (Telemedicine).  Patients are able to view lab/test results, encounter notes, upcoming appointments, etc.  Non-urgent messages can be sent to your provider as well.   To learn more about what you can do with  MyChart, go to [email or web address].   Other Instructions

## 2023-09-03 NOTE — Progress Notes (Signed)
 Cardiology Office Note:  .   Date:  09/03/2023  ID:  Christopher, Goodwin 04-Jun-1945, MRN [id number] PCP: Cherrie Cornwall, NP  [facility] Providers Cardiologist:  Knox Perl, MD   History of Present Illness: .   Christopher Goodwin is a 78 y.o. male  with permanent atrial fibrillation, ascending aortic aneurysm, mild hyperlipidemia. He has moderate thoracic aortic aneurysm at 4.7 cm that is stable by CT since 2016 and correlates well with echocardiogram. No Abdominal aortic aneurysm by CT in 2016, 2017 and 09/16/2021.  He has had a negative nuclear stress test in 2020 and a negative treadmill exercise stress test on 10/05/2019.  He also has chronic back pain due to advanced lumbosacral disc disease.  Discussed the use of AI scribe software for clinical note transcription with the patient, who gave verbal consent to proceed.  History of Present Illness Christopher Goodwin is a 78 year old male with atrial fibrillation and aortic aneurysm who presents for management of atrial fibrillation. He is accompanied by his wife, Christopher Goodwin.  He is asymptomatic with no chest pain, shortness of breath, palpitations, dizziness, or syncope. Atrial fibrillation is managed with 6 mg of warfarin daily and atenolol  for heart rate control, with no bleeding issues reported.  He maintains physical activity by swimming two to three times a week and occasional walking. His weight has been stable over the past year. Blood pressure is usually low, and he has never had hypertension.  Labs   External Labs:  PCP care everywhere labs 09/30/2022:  Hb 17.8/HCT 54.7, platelets 183.  Serum glucose 94 mg, BUN 17, creatinine 1.16, EGFR 65 mL, potassium 4.4, LFTs normal.  Total cholesterol 143, triglycerides 132, HDL 46, LDL 75.  ROS  Review of Systems  Cardiovascular:  Negative for chest pain, dyspnea on exertion and leg swelling.    Physical Exam:   VS:  BP 110/68 (BP Location: Left Arm, Patient Position: Sitting, Cuff  Size: Large)   Pulse 70   Ht 6\' 2"  (1.88 m)   Wt 275 lb (124.7 kg)   SpO2 93%   BMI 35.31 kg/m    Wt Readings from Last 3 Encounters:  09/03/23 275 lb (124.7 kg)  09/04/22 275 lb (124.7 kg)  08/30/21 268 lb (121.6 kg)    Physical Exam Neck:     Vascular: No carotid bruit or JVD.  Cardiovascular:     Rate and Rhythm: Normal rate. Rhythm irregular.     Pulses: Normal pulses and intact distal pulses.     Heart sounds: No murmur heard. Pulmonary:     Effort: Pulmonary effort is normal.     Breath sounds: Normal breath sounds.  Abdominal:     General: Bowel sounds are normal.     Palpations: Abdomen is soft.  Musculoskeletal:     Right lower leg: No edema.     Left lower leg: No edema.  Skin:    Capillary Refill: Capillary refill takes less than 2 seconds.    Studies Reviewed: Aaron Aas    ECHOCARDIOGRAM COMPLETE 08/24/2023  1. Hepatic cysts noted on sub costal images. 2. Left ventricular ejection fraction, by estimation, is 35 to 40%. The left ventricle has moderately decreased function. The left ventricle demonstrates global hypokinesis. Left ventricular diastolic parameters are indeterminate. 3. Right ventricular systolic function is normal. The right ventricular size is normal. 4. Left atrial size was moderately dilated. 5. Right atrial size was mildly dilated. 6. The mitral valve is abnormal. Mild mitral  valve regurgitation. No evidence of mitral stenosis. 7. The aortic valve is tricuspid. There is mild calcification of the aortic valve. Aortic valve regurgitation is not visualized. Aortic valve sclerosis is present, with no evidence of aortic valve stenosis. 8. Aortic dilatation noted. There is severe dilatation of the aortic root, measuring 47 mm. There is severe dilatation of the ascending aorta, measuring 47 mm. 9. The inferior vena cava is normal in size with greater than 50% respiratory variability, suggesting right atrial pressure of 3 mmHg.  EKG:    EKG  Interpretation Date/Time:  Thursday Sep 03 2023 09:55:07 EDT Ventricular Rate:  70 PR Interval:    QRS Duration:  98 QT Interval:  392 QTC Calculation: 423 R Axis:   -29  Text Interpretation: EKG 09/03/2023: Atrial fibrillation with controlled ventricular response at the rate of 70 bpm, leftward axis, single PVC.  Nonspecific T abnormality. Compared to 09/04/2022, leftward axis new. Confirmed by Dai Mcadams, Jagadeesh ([id number]) on 09/03/2023 9:58:31 AM    Medications and allergies    No Known Allergies   Current Outpatient Medications:    atenolol  (TENORMIN ) 50 MG tablet, Take 50 mg by mouth every evening. , Disp: , Rfl:    atorvastatin  (LIPITOR) 10 MG tablet, TAKE ONE-HALF TABLET BY MOUTH ONCE DAILY AS DIRECTED, Disp: 45 tablet, Rfl: 1   Cholecalciferol  (VITAMIN D ) 2000 UNITS CAPS, Take 2,000 Units every evening by mouth. , Disp: , Rfl:    cyanocobalamin  (,VITAMIN B-12,) 1000 MCG/ML injection, Inject 1,000 mcg into the muscle every 30 (thirty) days., Disp: , Rfl:    folic acid  (FOLVITE ) 1 MG tablet, Take 1 mg every evening by mouth. , Disp: , Rfl:    nitrofurantoin  (MACRODANTIN ) 100 MG capsule, Take 100 mg by mouth daily., Disp: , Rfl:    tamsulosin  (FLOMAX ) 0.4 MG CAPS capsule, Take 0.4 mg every evening by mouth., Disp: , Rfl: 10   vitamin C (ASCORBIC ACID ) 500 MG tablet, Take 500 mg by mouth daily., Disp: , Rfl:    warfarin (COUMADIN ) 6 MG tablet, Take 1 tablet (6 mg total) by mouth every evening., Disp: 30 tablet, Rfl: 0   zinc  gluconate 50 MG tablet, Take 50 mg by mouth daily., Disp: , Rfl:    No orders of the defined types were placed in this encounter.    Medications Discontinued During This Encounter  Medication Reason   nitroGLYCERIN  (NITROSTAT ) 0.4 MG SL tablet No longer needed (for PRN medications)     ASSESSMENT AND PLAN: .      ICD-10-CM   1. Permanent atrial fibrillation (HCC)  I48.21 EKG 12-Lead    Basic Metabolic Panel (BMET)    2. Aneurysm of ascending aorta without  rupture (HCC)  I71.21 EKG 12-Lead    CT ANGIO CHEST AORTA W/CM & OR WO/CM    Basic Metabolic Panel (BMET)      Assessment and Plan Assessment & Plan Permanent atrial fibrillation Permanent atrial fibrillation is well managed with warfarin and atenolol . INR is managed by his family doctor. Heart rate is well controlled with atenolol , and he is asymptomatic with no episodes of chest pain, shortness of breath, or palpitations. - Continue warfarin as prescribed by family doctor. - Continue atenolol  for heart rate control.  Aortic aneurysm Aortic aneurysm measures 47 mm and has been stable since 2016. Echocardiogram and CT scan show no expansion. Blood pressure is well controlled, which is beneficial for aneurysm management. Plan to alternate between echocardiogram and CT scan annually to monitor stability. - Order  CT scan next year before follow-up appointment. - Continue current blood pressure management.   OV in a year.   Signed,  Knox Perl, MD, [facility] 09/03/2023, 10:32 AM [facility] [address] [place], [place] [address] Phone: [phone]. Fax:  [phone]

## 2023-09-03 NOTE — Addendum Note (Signed)
 Addended by: Evren Shankland L on: 09/03/2023 10:38 AM   Modules accepted: Orders

## 2024-02-29 ENCOUNTER — Telehealth (HOSPITAL_BASED_OUTPATIENT_CLINIC_OR_DEPARTMENT_OTHER): Payer: Self-pay

## 2024-02-29 NOTE — Telephone Encounter (Signed)
   Pre-operative Risk Assessment    Patient Name: Christopher Goodwin  DOB: 09/26/1945 MRN: [id number]   Date of last office visit: 09/03/23 with Ladona  Date of next office visit: NA    Request for Surgical Clearance    Procedure:  Left L3 TFESI  Date of Surgery:  Clearance TBD                                 Surgeon:  Dr. Darlis Socks Group or Practice Name:  [facility] Phone number:  [phone] Fax number:  [phone]   Type of Clearance Requested:   - Medical  - Pharmacy:  Hold Warfarin (Coumadin ) for 5 days prior and resume next day post procedure   Type of Anesthesia:  Not Indicated   Additional requests/questions:    Bonney Augustin JONETTA Delores   02/29/2024, 11:10 AM

## 2024-03-02 ENCOUNTER — Telehealth (HOSPITAL_BASED_OUTPATIENT_CLINIC_OR_DEPARTMENT_OTHER): Payer: Self-pay | Admitting: *Deleted

## 2024-03-02 NOTE — Telephone Encounter (Signed)
   Name: Christopher Goodwin  DOB: 02/27/1946  MRN: [id number]  Primary Cardiologist: Gordy Bergamo, MD Last OV 09/03/23  Preoperative team, please contact this patient and set up a phone call appointment for further preoperative risk assessment. Please obtain consent and complete medication review. Thank you for your help.  I confirm that guidance regarding antiplatelet and oral anticoagulation therapy has been completed and, if necessary, noted below.  Patient has not had an Afib/aflutter ablation in the last 3 months, DCCV within the last 4 weeks or a watchman implanted in the last 45 days  Per office protocol, patient can hold warfarin for 5 days prior to procedure.   Patient will not need bridging with Lovenox (enoxaparin) around procedure.  I also confirmed the patient resides in the state of [place] . As per [facility] Medical Board telemedicine laws, the patient must reside in the state in which the provider is licensed.  Leona Pressly D Kymberlee Viger, NP 03/02/2024, 7:14 AM [facility]

## 2024-03-02 NOTE — Telephone Encounter (Signed)
 Pt is returning call. He is out of town. Please call mobile # [phone]

## 2024-03-02 NOTE — Telephone Encounter (Signed)
Left message for the pt to call back and schedule a tele pre op appt.  

## 2024-03-02 NOTE — Telephone Encounter (Signed)
 Patient with diagnosis of afib on warfarin for anticoagulation.    Procedure: Left L3 TFESI  Date of procedure: TBD   CHA2DS2-VASc Score = 4   This indicates a 4.8% annual risk of stroke. The patient's score is based upon: CHF History: 1 HTN History: 0 Diabetes History: 0 Stroke History: 0 Vascular Disease History: 1 Age Score: 2 Gender Score: 0      CrCl 73 ml/min Platelet count 181  Patient has not had an Afib/aflutter ablation in the last 3 months, DCCV within the last 4 weeks or a watchman implanted in the last 45 days   Per office protocol, patient can hold warfarin for 5 days prior to procedure.    Patient will not need bridging with Lovenox (enoxaparin) around procedure.  **This guidance is not considered finalized until pre-operative APP has relayed final recommendations.**

## 2024-03-02 NOTE — Telephone Encounter (Signed)
 Pt has been scheduled tele preop appt 03/11/24. Med rec and consent are done.

## 2024-03-02 NOTE — Telephone Encounter (Signed)
 Pt has been scheduled tele preop appt 03/11/24. Med rec and consent are done.      Patient Consent for Virtual Visit        [name] has provided verbal consent on 03/02/2024 for a virtual visit (video or telephone).   CONSENT FOR VIRTUAL VISIT FOR:  Christopher Goodwin  By participating in this virtual visit I agree to the following:  I hereby voluntarily request, consent and authorize [facility] and its employed or contracted physicians, physician assistants, nurse practitioners or other licensed health care professionals (the Practitioner), to provide me with telemedicine health care services (the "Services) as deemed necessary by the treating Practitioner. I acknowledge and consent to receive the Services by the Practitioner via telemedicine. I understand that the telemedicine visit will involve communicating with the Practitioner through live audiovisual communication technology and the disclosure of certain medical information by electronic transmission. I acknowledge that I have been given the opportunity to request an in-person assessment or other available alternative prior to the telemedicine visit and am voluntarily participating in the telemedicine visit.  I understand that I have the right to withhold or withdraw my consent to the use of telemedicine in the course of my care at any time, without affecting my right to future care or treatment, and that the Practitioner or I may terminate the telemedicine visit at any time. I understand that I have the right to inspect all information obtained and/or recorded in the course of the telemedicine visit and may receive copies of available information for a reasonable fee.  I understand that some of the potential risks of receiving the Services via telemedicine include:  Delay or interruption in medical evaluation due to technological equipment failure or disruption; Information transmitted may not be sufficient (e.g. poor  resolution of images) to allow for appropriate medical decision making by the Practitioner; and/or  In rare instances, security protocols could fail, causing a breach of personal health information.  Furthermore, I acknowledge that it is my responsibility to provide information about my medical history, conditions and care that is complete and accurate to the best of my ability. I acknowledge that Practitioner's advice, recommendations, and/or decision may be based on factors not within their control, such as incomplete or inaccurate data provided by me or distortions of diagnostic images or specimens that may result from electronic transmissions. I understand that the practice of medicine is not an exact science and that Practitioner makes no warranties or guarantees regarding treatment outcomes. I acknowledge that a copy of this consent can be made available to me via my patient portal [facility] MyChart), or I can request a printed copy by calling the office of [facility].    I understand that my insurance will be billed for this visit.   I have read or had this consent read to me. I understand the contents of this consent, which adequately explains the benefits and risks of the Services being provided via telemedicine.  I have been provided ample opportunity to ask questions regarding this consent and the Services and have had my questions answered to my satisfaction. I give my informed consent for the services to be provided through the use of telemedicine in my medical care

## 2024-03-11 ENCOUNTER — Ambulatory Visit: Attending: Cardiovascular Disease | Admitting: Nurse Practitioner

## 2024-03-11 DIAGNOSIS — Z01810 Encounter for preprocedural cardiovascular examination: Secondary | ICD-10-CM | POA: Diagnosis present

## 2024-03-11 NOTE — Progress Notes (Signed)
 Virtual Visit via Telephone Note   Because of Christopher Goodwin co-morbid illnesses, he is at least at moderate risk for complications without adequate follow up.  This format is felt to be most appropriate for this patient at this time.  Due to technical limitations with video connection (technology), today's appointment will be conducted as an audio only telehealth visit, and Christopher Goodwin verbally agreed to proceed in this manner.   All issues noted in this document were discussed and addressed.  No physical exam could be performed with this format.  Evaluation Performed:  Preoperative cardiovascular risk assessment _____________   Date:  03/11/2024   Patient ID:  Christopher Goodwin, DOB 1945/10/28, MRN [id number] Patient Location:  Home Provider location:   Office  Primary Care Provider:  Shlomo Silvano FERNS, NP Primary Cardiologist:  Christopher Bergamo, MD  Chief Complaint / Patient Profile   78 y.o. y/o male with a h/o permanent atrial fibrillation on chronic anticoagulation, ascending aortic aneurysm with moderate thoracic aneurysm 4.7 stable by CT since 2016, mild hyperlipidemia, chronic back pain who is pending left L3 TFESI with Dr. Darlis on date TBD and presents today for telephonic preoperative cardiovascular risk assessment.  History of Present Illness    Christopher Goodwin is a 78 y.o. male who presents via audio/video conferencing for a telehealth visit today.  Pt was last seen in cardiology clinic on 09/03/23 by Dr. Bergamo.  At that time Christopher Goodwin was doing well.  The patient is now pending procedure as outlined above. Since his last visit, he denies chest pain, shortness of breath, edema, fatigue, palpitations, melena, hematuria, hemoptysis, diaphoresis, weakness, presyncope, syncope, orthopnea, and PND. He is active with swimming, but has been limited by leg numbness for the last few weeks. He is able to complete > 4 METS activity without concerning cardiac symptoms. He reports history  of low normal blood pressure generally 90s over 60s mmHg.   Past Medical History    Past Medical History:  Diagnosis Date   Anticoagulated on Coumadin     Arthritis    BPH (benign prostatic hyperplasia)    Cardiomyopathy, dilated, nonischemic (HCC)    by 2008 cath and 2013 stress test results; as of 12/28/14: has been followed by Dr. Fairy Olea in [place] (last seen 11/2014), moved to [facility] 12/2014   Chronic atrial fibrillation [facility])    cardiologist-  dr Goodwin---  per pt lov 10/ 2018   Dilated aortic root    4.9 cm by 05/26/14 echo [facility], Dr. Olea)   Dysrhythmia    Foley catheter in place    Hyperlipidemia    Permanent atrial fibrillation (HCC) 09/01/2018   PONV (postoperative nausea and vomiting)    Thoracic ascending aortic aneurysm    per CT 08-10-2015  4.0cm   Urinary retention    Wears glasses    Past Surgical History:  Procedure Laterality Date   ANTERIOR CERVICAL DECOMPRESSION/DISCECTOMY FUSION 4 LEVELS N/A 11/02/2020   Procedure: Anterior Cervical Decompression Fusion - Cervical three-Cervical four - Cervical four-Cervical five - Cervical five-Cervical six - Cervical six-Cervical seven;  Surgeon: Onetha Kuba, MD;  Location: [facility] OR;  Service: Neurosurgery;  Laterality: N/A;   BACK SURGERY     CARDIAC CATHETERIZATION  04/10/2006  [place], [place]    [facility]): Mild plaquing w/o obstructive CAD, LVEF 40-45%, normal right heart pressures.    CARDIOVASCULAR STRESS TEST  01-09-2017   DR Goodwin   Low risk myocardial perfusion study  w/ no ischemia/  LV function 45% (but visually appears normal)   CYSTOSCOPY N/A 01/30/2017   Procedure: CYSTOSCOPY;  Surgeon: Gaston Hamilton, MD;  [facility];  Service: Urology;  Laterality: N/A;   JOINT REPLACEMENT     LUMBAR LAMINECTOMY/DECOMPRESSION MICRODISCECTOMY Left 01/03/2015   Procedure: Lumbar four, lumbar five left Laminectomy and Foraminotomy ;  Surgeon: Arley Helling,  MD;  Location: [facility];  Service: Neurosurgery;  Laterality: Left;   TOTAL KNEE ARTHROPLASTY Bilateral 2007;  2003   TRANSTHORACIC ECHOCARDIOGRAM  09-04-2016   DR LADONA   moderate LVH, ef 45%, mild generalized hypokinesis/ due to Afib unable to evaluate diastolic function/  mild LAE/  trivial MR & TR/  mild PR /  moderate dilated aortic root 4.8cm , mild atherosclerotic aorta/  IVC dilated with respiratory variation   TRANSURETHRAL RESECTION OF PROSTATE N/A 01/30/2017   Procedure: TRANSURETHRAL RESECTION OF THE PROSTATE (TURP);  Surgeon: Gaston Hamilton, MD;  Location: [facility];  Service: Urology;  Laterality: N/A;    Allergies  No Known Allergies  Home Medications    Prior to Admission medications   Medication Sig Start Date End Date Taking? Authorizing Provider  atenolol  (TENORMIN ) 50 MG tablet Take 50 mg by mouth every evening.     [provider]  atorvastatin  (LIPITOR) 10 MG tablet TAKE ONE-HALF TABLET BY MOUTH ONCE DAILY AS DIRECTED 09/03/23   Ladona Heinz, MD  Cholecalciferol  (VITAMIN D ) 2000 UNITS CAPS Take 2,000 Units every evening by mouth.     [provider]  cyanocobalamin  (,VITAMIN B-12,) 1000 MCG/ML injection Inject 1,000 mcg into the muscle every 30 (thirty) days. 04/21/19   [provider]  folic acid  (FOLVITE ) 1 MG tablet Take 1 mg every evening by mouth.     [provider]  nitrofurantoin  (MACRODANTIN ) 100 MG capsule Take 100 mg by mouth daily.    [provider]  tamsulosin  (FLOMAX ) 0.4 MG CAPS capsule Take 0.4 mg every evening by mouth. 02/04/17   [provider]  vitamin C (ASCORBIC ACID ) 500 MG tablet Take 500 mg by mouth daily.    [provider]  warfarin (COUMADIN ) 6 MG tablet Take 1 tablet (6 mg total) by mouth every evening. 11/16/20   Dawley, Troy C, DO  zinc  gluconate 50 MG tablet Take 50 mg by mouth daily.    [provider]    Physical Exam    Vital Signs:  Christopher Goodwin does not have vital signs available for review today.  Given telephonic nature of communication, physical exam is limited. AAOx3. NAD. Normal affect.  Speech and respirations are unlabored.  Accessory Clinical Findings    None  Assessment & Plan    1.  Preoperative Cardiovascular Risk Assessment: According to the Revised Cardiac Risk Index (RCRI), his Perioperative Risk of Major Cardiac Event is (%): 0.9. His Functional Capacity in METs is: 7.53 according to the Duke Activity Status Index (DASI). The patient is doing well from a cardiac perspective. Therefore, based on ACC/AHA guidelines, the patient would be at acceptable risk for the planned procedure without further cardiovascular testing.   The patient was advised that if he develops new symptoms prior to surgery to contact our office to arrange for a follow-up visit, and he verbalized understanding.  Per office protocol, patient can hold warfarin for 5 days prior to procedure.  He should resume warfarin as soon as hemodynamically stable post procedure. Patient will not need bridging with Lovenox (  enoxaparin) around procedure.   A copy of this note will be routed to requesting surgeon.  Time:   Today, I have spent 10 minutes with the patient with telehealth technology discussing medical history, symptoms, and management plan.     Christopher EMERSON Bane, NP-C  03/11/2024, 9:27 AM [address], [address] [place], [place] [address] Office [phone] Fax [phone]
